# Patient Record
Sex: Male | Born: 1935 | Race: White | Hispanic: No | Smoking: Never smoker
Health system: Southern US, Community
[De-identification: ages and names within clinical notes are randomized; demographics above are authoritative.]

## PROBLEM LIST (undated history)

## (undated) DIAGNOSIS — I509 Heart failure, unspecified: Secondary | ICD-10-CM

## (undated) DIAGNOSIS — N429 Disorder of prostate, unspecified: Secondary | ICD-10-CM

## (undated) DIAGNOSIS — I4891 Unspecified atrial fibrillation: Secondary | ICD-10-CM

## (undated) DIAGNOSIS — R001 Bradycardia, unspecified: Secondary | ICD-10-CM

## (undated) HISTORY — PX: HIP SURGERY: SHX245

## (undated) HISTORY — PX: COLONOSCOPY: SHX174

## (undated) HISTORY — PX: APPENDECTOMY: SHX54

---

## 1997-10-21 ENCOUNTER — Ambulatory Visit (HOSPITAL_COMMUNITY): Admission: RE | Admit: 1997-10-21 | Discharge: 1997-10-21 | Payer: Self-pay | Admitting: Internal Medicine

## 1997-11-20 ENCOUNTER — Ambulatory Visit (HOSPITAL_COMMUNITY): Admission: RE | Admit: 1997-11-20 | Discharge: 1997-11-20 | Payer: Self-pay | Admitting: Internal Medicine

## 1997-12-17 ENCOUNTER — Ambulatory Visit (HOSPITAL_COMMUNITY): Admission: RE | Admit: 1997-12-17 | Discharge: 1997-12-17 | Payer: Self-pay | Admitting: Gastroenterology

## 1998-03-04 ENCOUNTER — Encounter: Payer: Self-pay | Admitting: Internal Medicine

## 1998-03-04 ENCOUNTER — Ambulatory Visit (HOSPITAL_COMMUNITY): Admission: RE | Admit: 1998-03-04 | Discharge: 1998-03-04 | Payer: Self-pay | Admitting: Internal Medicine

## 1998-10-19 ENCOUNTER — Ambulatory Visit (HOSPITAL_COMMUNITY): Admission: RE | Admit: 1998-10-19 | Discharge: 1998-10-19 | Payer: Self-pay | Admitting: Internal Medicine

## 2000-02-08 ENCOUNTER — Encounter: Payer: Self-pay | Admitting: Internal Medicine

## 2000-02-08 ENCOUNTER — Encounter: Admission: RE | Admit: 2000-02-08 | Discharge: 2000-02-08 | Payer: Self-pay | Admitting: Internal Medicine

## 2001-07-25 ENCOUNTER — Encounter: Payer: Self-pay | Admitting: Orthopedic Surgery

## 2001-07-25 ENCOUNTER — Encounter: Payer: Self-pay | Admitting: Emergency Medicine

## 2001-07-25 ENCOUNTER — Inpatient Hospital Stay (HOSPITAL_COMMUNITY): Admission: EM | Admit: 2001-07-25 | Discharge: 2001-07-26 | Payer: Self-pay | Admitting: Emergency Medicine

## 2002-12-30 ENCOUNTER — Ambulatory Visit (HOSPITAL_COMMUNITY): Admission: RE | Admit: 2002-12-30 | Discharge: 2002-12-30 | Payer: Self-pay | Admitting: Gastroenterology

## 2003-10-28 ENCOUNTER — Encounter: Admission: RE | Admit: 2003-10-28 | Discharge: 2003-10-28 | Payer: Self-pay | Admitting: Orthopedic Surgery

## 2004-06-24 ENCOUNTER — Encounter: Admission: RE | Admit: 2004-06-24 | Discharge: 2004-06-24 | Payer: Self-pay | Admitting: Internal Medicine

## 2004-12-14 ENCOUNTER — Encounter: Admission: RE | Admit: 2004-12-14 | Discharge: 2004-12-14 | Payer: Self-pay | Admitting: Internal Medicine

## 2005-08-11 ENCOUNTER — Ambulatory Visit: Payer: Self-pay

## 2005-08-12 ENCOUNTER — Ambulatory Visit: Payer: Self-pay | Admitting: Cardiovascular Disease

## 2005-08-15 ENCOUNTER — Inpatient Hospital Stay (HOSPITAL_COMMUNITY): Admission: RE | Admit: 2005-08-15 | Discharge: 2005-08-18 | Payer: Self-pay | Admitting: Orthopedic Surgery

## 2005-09-07 ENCOUNTER — Encounter: Admission: RE | Admit: 2005-09-07 | Discharge: 2005-10-03 | Payer: Self-pay | Admitting: Internal Medicine

## 2005-10-04 ENCOUNTER — Encounter: Admission: RE | Admit: 2005-10-04 | Discharge: 2005-11-07 | Payer: Self-pay | Admitting: Internal Medicine

## 2005-11-08 ENCOUNTER — Encounter: Admission: RE | Admit: 2005-11-08 | Discharge: 2005-11-17 | Payer: Self-pay | Admitting: Internal Medicine

## 2007-09-27 ENCOUNTER — Encounter (INDEPENDENT_AMBULATORY_CARE_PROVIDER_SITE_OTHER): Payer: Self-pay | Admitting: Internal Medicine

## 2007-09-27 ENCOUNTER — Ambulatory Visit (HOSPITAL_COMMUNITY): Admission: RE | Admit: 2007-09-27 | Discharge: 2007-09-27 | Payer: Self-pay | Admitting: Internal Medicine

## 2008-12-19 ENCOUNTER — Emergency Department (HOSPITAL_COMMUNITY): Admission: EM | Admit: 2008-12-19 | Discharge: 2008-12-19 | Payer: Self-pay | Admitting: Family Medicine

## 2009-07-20 ENCOUNTER — Emergency Department (HOSPITAL_COMMUNITY): Admission: EM | Admit: 2009-07-20 | Discharge: 2009-07-20 | Payer: Self-pay | Admitting: Family Medicine

## 2010-07-30 NOTE — Op Note (Signed)
Duane Spears, Duane Spears                ACCOUNT NO.:  [id number]   MEDICAL RECORD NO.:  [id number]          PATIENT TYPE:  INP   LOCATION:  2550                         FACILITY:  [facility]   PHYSICIAN:  Robert A. Noemi Chapel, M.D. DATE OF BIRTH:  October 10, 1920   DATE OF PROCEDURE:  08/15/2005  DATE OF DISCHARGE:                                 OPERATIVE REPORT   PREOPERATIVE DIAGNOSIS:  Left hip degenerative joint disease.   POSTOPERATIVE DIAGNOSIS:  Left hip degenerative joint disease.   PROCEDURE:  Left total hip replacement using DePuy cementless Summit/ASR  total hip system with #60 press-fit ASR acetabular cup with #8 press-fit  Summit femoral stem with +2 x 55-mm femoral head.   SURGEON:  Audree Camel. Noemi Chapel, M.D.   ASSISTANT:  Matthew Saras, P.A.   ANESTHESIA:  General.   OPERATIVE TIME:  One hour and 30 minutes.   ESTIMATED BLOOD LOSS:  300 cc.   COMPLICATIONS:  None.   DESCRIPTION OF PROCEDURE:  Duane Spears was brought to the operating room, on  August 15, 2005, and placed on the operative table in supine position.  He  received 2 grams of Ancef IV preoperatively for prophylaxis.  After being  placed under general anesthesia, he had a Foley catheter placed under  sterile conditions.  His leg lengths were checked and was found to 1 inch of  shortening on the left compared to the right.  Hip range of motion was noted  at 90 degrees of flexion, 20 degrees of extension, internal and external  rotation of 20 degrees.  After being placed under general anesthesia, he was  then placed in the left lateral decubitus position, and secured on the bed  with the Mark frame.  His left hip and leg were prepped using sterile  DuraPrep and draped using sterile technique.  Originally, through a 15 cm  posterolateral greater trochanteric incision, initial exposure was made.  The underlying subcutaneous tissues were incised along with the skin  incision.  The iliotibial band and gluteus maximus fascia  were incised  longitudinally, revealing the underlying short external rotators of the hip.  Sciatic nerve carefully protected while the short external rotators of the  hip and hip capsule were released off their femoral neck insertion and  tagged.  The hip was then posteriorly dislocated.  He was found to have  grade 3 and 4 DJD of the femoral head.  Femoral neck cut was made 1.5-2 cm  above the lesser trochanter, in the appropriate amount of anteversion,  abduction and inclination.  Carefully placed retractors were placed around  the acetabulum.  Degenerative labrum was then removed from around the  acetabulum.  The acetabulum showed grade 3 and 4 DJD chondromalacia as well.  Sequential acetabular reamers were then used to ream up to a 59 mm size and  the appropriate amount of anteversion, abduction and inclination, and then a  60-mm trial cup was placed.  This gave an excellent press-fit.  It was then  removed and the actual 60 ASR cup was hammered into position in the  appropriate amount of anteversion,  abduction and inclination with an  excellent press-fit.  After this was done, the proximal femur was then  exposed.  Sequential axial reamers were used to ream up to a #8 size,  followed by broaches to a #8 size.  With the #8 broach trial in place and a  +2, 55-mm ASR head, the hip was reduced and taken through a full range of  motion, found to be stable up to 70 degrees of internal rotation, in both  neutral and 90 degrees of flexion, and neutral and 80 degrees of adduction,  and also stable in abduction and external rotation, and leg lengths were  found to be equalized.  At this point then the femoral trial was removed.  The femoral canal was irrigated, and then the actual #8 Summit stem was  hammered in position with an excellent press-fit.  The +2 x 55-mm femoral  head was placed on the femoral neck, and hammered into position with an  excellent Morse taper hip.  The hip then reduced,  again taken through a full  range of motion, found to be stable in neutral and 30 degrees of adduction,  up to 70 degrees of internal rotation.  Leg lengths were found to be equal,  also stable in abduction, external rotation.  At this point it was felt that  all the components were of excellent size, fit and stability.  The wound was  then further irrigated.  The short external rotators of the hip and hip  capsule were then reattached to their femoral neck insertion, through two  drill holes in the greater trochanter.  Iliotibial band and gluteus maximus  fascia were then closed with #1 Ethibond sutures.  Subcutaneous tissues were  closed with 0 and 2-0 Vicryl, and skin closed with skin staples.  Sterile  dressings were applied, knee immobilizer was placed.  Patient was turned  supine, checked for leg lengths that were equal, rotation was equal, pulses  2+ and symmetric.  He was then awakened, extubated and taken to recovery  room in stable condition.  Needle and sponge counts were correct times two  at the end of the case.      Robert A. Noemi Chapel, M.D.  Electronically Signed     RAW/MEDQ  D:  08/15/2005  T:  08/16/2005  Job:  [id number]

## 2010-07-30 NOTE — Op Note (Signed)
[place]  Patient:    Duane Spears, Duane Spears Visit Number: [id number] MRN: [id number]          Service Type: SUR Location: [place] 01 Attending Physician:  Lara Mulch Dictated by:   Lara Mulch, M.D. Proc. Date: 07/25/01 Admit Date:  07/25/2001 Discharge Date: 07/26/2001                             Operative Report  PREOPERATIVE DIAGNOSIS:  Right total hip dislocation.  POSTOPERATIVE DIAGNOSIS:  Right total hip dislocation.  OPERATION PERFORMED:  Closed reduction of right total hip dislocation under anesthesia.  SURGEON:  Lara Mulch, M.D.  ASSISTANT:  None.  ANESTHESIA:  Mask general.  INDICATIONS FOR PROCEDURE:  The patient is a 77 year old white male six to eight years status post primary total hip arthroplasty with a dislocation episode two years ago.  It closed.  He was tying his shoe, felt a pop and was brought to the emergency room by his wife and son.  Preoperative medical clearance was obtained and cleared by the anesthesia staff and informed consent was obtained.  DESCRIPTION OF PROCEDURE:  The patient was laid supine and administered general anesthesia.  A flexion adduction of the hip was performed and the patient was very easily relocated.  This was verified with AP and lateral imaging of the C-arm.  I then tested it and it did impinge at approximately 80 to 85 degrees of hip flexion and approximately 10 degrees of internal rotation, adduction.  I then placed the patient in an abduction pillow and he was awakened and taken to the recovery room in stable condition.  COMPLICATIONS:  None.  DRAINS:  None.  ESTIMATED BLOOD LOSS:  None. Dictated by:   Lara Mulch, M.D. Attending Physician:  Lara Mulch DD:  07/25/01 TD:  07/27/01 Job: [id number]

## 2010-07-30 NOTE — Discharge Summary (Signed)
[name], [name]                ACCOUNT NO.:  [id number]   MEDICAL RECORD NO.:  [id number]          PATIENT TYPE:  INP   LOCATION:  5006                         FACILITY:  [facility]   PHYSICIAN:  Duane Spears, M.D. DATE OF BIRTH:  06/01/1920   DATE OF ADMISSION:  08/15/2005  DATE OF DISCHARGE:  08/18/2005                                 DISCHARGE SUMMARY   ADMISSION DIAGNOSES:  1.  End-stage degenerative joint disease, left hip.  2.  Hypertension.  3.  History of a myocardial infarction.  4.  First degree atrioventricular block.  5.  History of kidney stones.   DISCHARGE DIAGNOSES:  1.  End-stage degenerative joint disease, left hip, status post total hip      replacement.  2.  Urinary retention.  3.  Hyperglycemia.  4.  Hypertension.  5.  History of a myocardial infarction.  6.  First degree atrioventricular block.   HISTORY OF PRESENT ILLNESS:  The patient is an 77 year old white male with  history of end-stage DJD of his left hip.  He is status post a right total  hip in 1995 and has done very well with his right total hip.  Now he has  pain at night, pain with rest, pain unrelieved by conservative care.  He  understands the risks, benefits and possible complications of a left total  hip replacement and is without question.   PROCEDURES IN-HOUSE:  On August 15, 2005, the patient underwent a left total  hip replacement by Dr. Noemi Spears.  He tolerated the procedure well.  Other  procedures in-house:  He had a Foley that was removed postop day 1, had  difficulty with urinary retention, subsequently underwent 3 in-and-out  catheterizations with the third catheter being left in.  On August 18, 2005,  his postvoid residual was 1100 mL at the time of the third catheterization;  therefore, the catheter was left in.   HOSPITAL COURSE:  The patient was admitted postoperatively from his left  total hip for pain control, DVT prophylaxis and physical therapy.  Postop  day 1 the catheter  was removed, 2-1/2 hours later the patient was in-and-out  catheterized with a postvoid residual of 600 mL.  Social work was consulted  for skilled nursing placement.  He had a T-max of 101.3.  His hemoglobin was  13.1.  He was metabolically stable.  Urecholine was started at 25 mg q.6h.  Postop day #2 the patient once again spiked a temperature of 102.  At that  time a urine was sent, in which the culture grew nothing.  Chest x-ray was  done, which came back negative.  Blood cultures so far are negative.  Postop  day #2, T-max of 102.  Hemoglobin was 12.4.  He was metabolically stable  with a BUN of 16 and a creatinine of 1.0.  He had a small amount of drainage  from his wound and was neurovascularly intact.  Postop day #3, the patient  was afebrile x24 hours.  His hemoglobin was 11.2.  His renal function was  decreased with a BUN of 24 and  a creatinine of 1.4.  At that time his  Urecholine was stopped.  He was continued on Flomax 0.4 mg.  He was having  pressure from urinary retention.  The catheter was placed with 1100 mL of  postvoid residual.  The patient was much more comfortable with the catheter  in place.  Spoke with Dr. Serita Spears from urology.  He suggested leaving the  catheter with a leg bag for 7 days.  On August 25, 2005, the patient needs to  have the catheter removed at 6 o'clock in the morning.  He has an  appointment with Dr. Serita Spears at 1:45 at the urology center.  The telephone  number there is [phone].   With regard to his left hip wound, it is clean and dry with no redness, no  excessive ecchymosis, minimal swelling and minimal drainage.  He is to have  a clean dry dressing placed on his hip wound daily, and please call Dr.  Archie Spears office at [phone] with increased swelling, increased pain,  increased redness, increased drainage or a temperature greater than 101.   While in the hospital his morning CBGs have been in the 160s-170s, so he  will probably need CBGs  a.c. and h.s. to monitor those and a sliding scale  if necessary.   He will need physical therapy and occupational therapy daily for his left  total hip replacement.  He is 50% weightbearing on his left leg.  He needs  to use posterior total hip precautions with his left leg and needs to sleep  in a knee immobilizer.  He does not need to wear a knee immobilizer except  when he is in bed.  His follow-up appointment with Dr. Noemi Spears is on August 29, 2005, at 3:45 p.m.  At that time his staples will be removed.  He will have  x-rays and most likely he will be progressed.   While in the hospital he has been on Tylenol 325 mg two tablets every 4  hours for pain.  He has not required any narcotics for pain control.   DISCHARGE MEDICATIONS:  1.  Tylenol 2 tablets q.4h. for pain.  2.  Lovenox 30 mg one injection subcu q.12h. for 10 days for DVT      prophylaxis.  3.  Flomax 0.4 mg one tablet a day.  4.  Atenolol 50 mg one tablet a day.  5.  Aspirin 81 mg one tablet a day.  6.  Prevacid 30 mg one tablet a day.   Due to his decreased renal function, we have recommended holding his anti-  inflammatory at that time.  He was on diclofenac at home.  He was also on  Darvocet and Tylenol ES for pain.  Due to the amount of Tylenol in both of  these, I would recommend they be held at this time and no glucosamine.   My suggestion is a carbohydrate-modified 50 g medium diet due to his  increased sugars, increased CBGs in the hospital.  He is 50% weightbearing,  up ad lib. with supervision or assistance.      Duane Spears, P.A.      Duane Spears, M.D.  Electronically Signed    KS/MEDQ  D:  08/18/2005  T:  08/18/2005  Job:  [id number]

## 2010-07-30 NOTE — Op Note (Signed)
   NAME:  Duane Spears, Duane Spears                          ACCOUNT NO.:  [id number]   MEDICAL RECORD NO.:  [id number]                   PATIENT TYPE:  AMB   LOCATION:  ENDO                                 [facility]ACILITY[facility]   PHYSICIAN:  Earle Gell, M.D.                DATE OF BIRTH:  Oct 26, 1920   DATE OF PROCEDURE:  12/30/2002  DATE OF DISCHARGE:                                 OPERATIVE REPORT   PROCEDURE:  Surveillance colonoscopy.   PROCEDURE INDICATION:  Mr. [name] is a 77 year old male born Aug 20, 1920.  In 1999 Mr. [name] underwent his first colonoscopy to remove colon  polyps.  He is scheduled to undergo a surveillance colonoscopy with  polypectomy to prevent colon cancer.   ENDOSCOPIST:  Earle Gell, M.D.   PREMEDICATION:  Versed 5 mg, Demerol 50 mg.   DESCRIPTION OF PROCEDURE:  After obtaining informed consent, Mr. [name] was  placed in the left lateral decubitus position.  I administered intravenous  Demerol and intravenous Versed to achieve conscious sedation for the  procedure.  The patient's blood pressure, oxygen saturation, and cardiac  rhythm were monitored throughout the procedure and documented in the medical  record.   Anal inspection was normal.  Digital rectal exam revealed a non-nodular  prostate.  The Olympus pediatric colonoscope was introduced into the rectum  and advanced to the cecum.  Colonic preparation for the exam today was  excellent.   Rectum normal.   Sigmoid colon and descending colon:  Left colonic diverticulosis.   Splenic flexure normal.   Transverse colon normal.   Hepatic flexure normal.   Ascending colon normal.   Cecum and ileocecal valve normal.    ASSESSMENT:  1. Left colonic diverticulosis.  2. No endoscopic evidence for the presence of recurrent colorectal     neoplasia.   RECOMMENDATIONS:  Consider virtual colonoscopy or optical colonoscopy in  five years.                                               Earle Gell, M.D.    MJ/MEDQ  D:  12/30/2002  T:  12/30/2002  Job:  [id number]   cc:   Lance Muss, M.D.  [address] [place]  [place] [phone]  Fax: [phone]

## 2010-10-03 ENCOUNTER — Emergency Department (HOSPITAL_COMMUNITY)
Admission: EM | Admit: 2010-10-03 | Discharge: 2010-10-04 | Disposition: A | Discharge status: Home / Self Care | Payer: Medicare Other | Attending: Emergency Medicine | Admitting: Emergency Medicine

## 2010-10-03 DIAGNOSIS — R300 Dysuria: Secondary | ICD-10-CM | POA: Insufficient documentation

## 2010-10-03 DIAGNOSIS — N390 Urinary tract infection, site not specified: Secondary | ICD-10-CM | POA: Insufficient documentation

## 2010-10-03 DIAGNOSIS — R509 Fever, unspecified: Secondary | ICD-10-CM | POA: Insufficient documentation

## 2010-10-03 DIAGNOSIS — Z79899 Other long term (current) drug therapy: Secondary | ICD-10-CM | POA: Insufficient documentation

## 2010-10-03 DIAGNOSIS — I4891 Unspecified atrial fibrillation: Secondary | ICD-10-CM | POA: Insufficient documentation

## 2010-10-04 ENCOUNTER — Emergency Department (HOSPITAL_COMMUNITY): Payer: Medicare Other

## 2010-10-04 LAB — URINE MICROSCOPIC-ADD ON

## 2010-10-04 LAB — URINALYSIS, ROUTINE W REFLEX MICROSCOPIC
Bilirubin Urine: NEGATIVE
Glucose, UA: NEGATIVE mg/dL
Ketones, ur: NEGATIVE mg/dL
Nitrite: NEGATIVE
Protein, ur: 30 mg/dL — AB
Specific Gravity, Urine: 1.026 (ref 1.005–1.030)
Urobilinogen, UA: 0.2 mg/dL (ref 0.0–1.0)
pH: 6 (ref 5.0–8.0)

## 2010-10-04 LAB — DIFFERENTIAL
Basophils Absolute: 0 10*3/uL (ref 0.0–0.1)
Basophils Relative: 0 % (ref 0–1)
Eosinophils Absolute: 0 10*3/uL (ref 0.0–0.7)
Eosinophils Relative: 0 % (ref 0–5)
Lymphocytes Relative: 6 % — ABNORMAL LOW (ref 12–46)
Lymphs Abs: 0.7 10*3/uL (ref 0.7–4.0)
Monocytes Absolute: 1 10*3/uL (ref 0.1–1.0)
Monocytes Relative: 9 % (ref 3–12)
Neutro Abs: 9.6 10*3/uL — ABNORMAL HIGH (ref 1.7–7.7)
Neutrophils Relative %: 85 % — ABNORMAL HIGH (ref 43–77)

## 2010-10-04 LAB — BASIC METABOLIC PANEL
BUN: 19 mg/dL (ref 6–23)
CO2: 22 mEq/L (ref 19–32)
Calcium: 8.8 mg/dL (ref 8.4–10.5)
Chloride: 100 mEq/L (ref 96–112)
Creatinine, Ser: 0.68 mg/dL (ref 0.50–1.35)
GFR calc Af Amer: >60 mL/min (ref >60)
GFR calc non Af Amer: >60 mL/min (ref >60)
Glucose, Bld: 177 mg/dL — ABNORMAL HIGH (ref 70–99)
Potassium: 4 mEq/L (ref 3.5–5.1)
Sodium: 133 mEq/L — ABNORMAL LOW (ref 135–145)

## 2010-10-04 LAB — CBC
HCT: 44.4 % (ref 39.0–52.0)
Hemoglobin: 15 g/dL (ref 13.0–17.0)
MCH: 32.1 pg (ref 26.0–34.0)
MCHC: 33.8 g/dL (ref 30.0–36.0)
MCV: 95.1 fL (ref 78.0–100.0)
Platelets: 105 10*3/uL — ABNORMAL LOW (ref 150–400)
RBC: 4.67 MIL/uL (ref 4.22–5.81)
RDW: 14 % (ref 11.5–15.5)
WBC: 11.3 10*3/uL — ABNORMAL HIGH (ref 4.0–10.5)

## 2010-10-06 LAB — URINE CULTURE
Colony Count: >=100000
Culture  Setup Time: 201207230938

## 2010-10-10 LAB — CULTURE, BLOOD (ROUTINE X 2)
Culture  Setup Time: 201207230920
Culture  Setup Time: 201207230921
Culture: NO GROWTH
Culture: NO GROWTH

## 2011-12-22 ENCOUNTER — Other Ambulatory Visit (HOSPITAL_COMMUNITY): Payer: Self-pay | Admitting: Orthopedic Surgery

## 2011-12-22 DIAGNOSIS — M25559 Pain in unspecified hip: Secondary | ICD-10-CM

## 2011-12-27 ENCOUNTER — Ambulatory Visit (HOSPITAL_COMMUNITY)
Admission: RE | Admit: 2011-12-27 | Discharge: 2011-12-27 | Disposition: A | Discharge status: Home / Self Care | Payer: Medicare Other | Source: Ambulatory Visit | Attending: Orthopedic Surgery | Admitting: Orthopedic Surgery

## 2011-12-27 ENCOUNTER — Ambulatory Visit (HOSPITAL_COMMUNITY)
Admission: RE | Admit: 2011-12-27 | Discharge: 2011-12-27 | Disposition: A | Discharge status: Home / Self Care | Payer: Medicare Other | Source: Ambulatory Visit | Attending: Diagnostic Radiology | Admitting: Diagnostic Radiology

## 2011-12-27 ENCOUNTER — Other Ambulatory Visit (HOSPITAL_COMMUNITY): Payer: Self-pay | Admitting: Diagnostic Radiology

## 2011-12-27 DIAGNOSIS — M25559 Pain in unspecified hip: Secondary | ICD-10-CM | POA: Insufficient documentation

## 2011-12-27 DIAGNOSIS — IMO0002 Reserved for concepts with insufficient information to code with codable children: Secondary | ICD-10-CM

## 2011-12-27 DIAGNOSIS — K5730 Diverticulosis of large intestine without perforation or abscess without bleeding: Secondary | ICD-10-CM | POA: Insufficient documentation

## 2011-12-27 DIAGNOSIS — Z96649 Presence of unspecified artificial hip joint: Secondary | ICD-10-CM | POA: Insufficient documentation

## 2011-12-27 DIAGNOSIS — N400 Enlarged prostate without lower urinary tract symptoms: Secondary | ICD-10-CM | POA: Insufficient documentation

## 2011-12-27 DIAGNOSIS — R262 Difficulty in walking, not elsewhere classified: Secondary | ICD-10-CM | POA: Insufficient documentation

## 2013-10-11 ENCOUNTER — Emergency Department (INDEPENDENT_AMBULATORY_CARE_PROVIDER_SITE_OTHER)
Admission: EM | Admit: 2013-10-11 | Discharge: 2013-10-11 | Disposition: A | Discharge status: Nursing Home (Includes ICF) | Payer: Medicare Other | Source: Home / Self Care | Attending: Family Medicine | Admitting: Family Medicine

## 2013-10-11 ENCOUNTER — Encounter (HOSPITAL_COMMUNITY): Payer: Self-pay | Admitting: Emergency Medicine

## 2013-10-11 ENCOUNTER — Inpatient Hospital Stay (HOSPITAL_COMMUNITY)
Admission: EM | Admit: 2013-10-11 | Discharge: 2013-10-14 | DRG: 690 | Disposition: A | Discharge status: Home / Self Care | Payer: Medicare Other | Attending: Internal Medicine | Admitting: Internal Medicine

## 2013-10-11 ENCOUNTER — Emergency Department (INDEPENDENT_AMBULATORY_CARE_PROVIDER_SITE_OTHER): Payer: Medicare Other

## 2013-10-11 DIAGNOSIS — Z66 Do not resuscitate: Secondary | ICD-10-CM | POA: Diagnosis present

## 2013-10-11 DIAGNOSIS — I4891 Unspecified atrial fibrillation: Secondary | ICD-10-CM | POA: Diagnosis present

## 2013-10-11 DIAGNOSIS — B965 Pseudomonas (aeruginosa) (mallei) (pseudomallei) as the cause of diseases classified elsewhere: Secondary | ICD-10-CM | POA: Diagnosis present

## 2013-10-11 DIAGNOSIS — A499 Bacterial infection, unspecified: Secondary | ICD-10-CM

## 2013-10-11 DIAGNOSIS — N390 Urinary tract infection, site not specified: Secondary | ICD-10-CM

## 2013-10-11 DIAGNOSIS — R6510 Systemic inflammatory response syndrome (SIRS) of non-infectious origin without acute organ dysfunction: Secondary | ICD-10-CM

## 2013-10-11 DIAGNOSIS — Z7982 Long term (current) use of aspirin: Secondary | ICD-10-CM

## 2013-10-11 DIAGNOSIS — B9689 Other specified bacterial agents as the cause of diseases classified elsewhere: Secondary | ICD-10-CM

## 2013-10-11 DIAGNOSIS — D72829 Elevated white blood cell count, unspecified: Secondary | ICD-10-CM | POA: Diagnosis present

## 2013-10-11 DIAGNOSIS — I509 Heart failure, unspecified: Secondary | ICD-10-CM | POA: Diagnosis present

## 2013-10-11 DIAGNOSIS — N3000 Acute cystitis without hematuria: Secondary | ICD-10-CM

## 2013-10-11 DIAGNOSIS — A498 Other bacterial infections of unspecified site: Secondary | ICD-10-CM | POA: Diagnosis present

## 2013-10-11 DIAGNOSIS — N400 Enlarged prostate without lower urinary tract symptoms: Secondary | ICD-10-CM | POA: Diagnosis present

## 2013-10-11 DIAGNOSIS — A4150 Gram-negative sepsis, unspecified: Secondary | ICD-10-CM

## 2013-10-11 DIAGNOSIS — R509 Fever, unspecified: Secondary | ICD-10-CM

## 2013-10-11 HISTORY — DX: Disorder of prostate, unspecified: N42.9

## 2013-10-11 HISTORY — DX: Heart failure, unspecified: I50.9

## 2013-10-11 LAB — POCT URINALYSIS DIP (DEVICE)
Bilirubin Urine: NEGATIVE
Glucose, UA: NEGATIVE mg/dL
Ketones, ur: NEGATIVE mg/dL
Nitrite: POSITIVE — AB
Protein, ur: 30 mg/dL — AB
Specific Gravity, Urine: 1.015 (ref 1.005–1.030)
Urobilinogen, UA: 0.2 mg/dL (ref 0.0–1.0)
pH: 5.5 (ref 5.0–8.0)

## 2013-10-11 LAB — BASIC METABOLIC PANEL
Anion gap: 15 (ref 5–15)
BUN: 29 mg/dL — ABNORMAL HIGH (ref 6–23)
CO2: 25 mEq/L (ref 19–32)
Calcium: 9.1 mg/dL (ref 8.4–10.5)
Chloride: 99 mEq/L (ref 96–112)
Creatinine, Ser: 0.92 mg/dL (ref 0.50–1.35)
GFR, EST AFRICAN AMERICAN: 84 mL/min — AB (ref >90)
GFR, EST NON AFRICAN AMERICAN: 72 mL/min — AB (ref >90)
Glucose, Bld: 153 mg/dL — ABNORMAL HIGH (ref 70–99)
Potassium: 3.8 mEq/L (ref 3.7–5.3)
SODIUM: 139 meq/L (ref 137–147)

## 2013-10-11 LAB — CBC WITH DIFFERENTIAL/PLATELET
BASOS PCT: 0 % (ref 0–1)
Basophils Absolute: 0 10*3/uL (ref 0.0–0.1)
EOS ABS: 0 10*3/uL (ref 0.0–0.7)
Eosinophils Relative: 0 % (ref 0–5)
HCT: 39.7 % (ref 39.0–52.0)
Hemoglobin: 13.4 g/dL (ref 13.0–17.0)
Lymphocytes Relative: 8 % — ABNORMAL LOW (ref 12–46)
Lymphs Abs: 1.3 10*3/uL (ref 0.7–4.0)
MCH: 32.1 pg (ref 26.0–34.0)
MCHC: 33.8 g/dL (ref 30.0–36.0)
MCV: 95 fL (ref 78.0–100.0)
Monocytes Absolute: 1.5 10*3/uL — ABNORMAL HIGH (ref 0.1–1.0)
Monocytes Relative: 10 % (ref 3–12)
NEUTROS PCT: 82 % — AB (ref 43–77)
Neutro Abs: 12.8 10*3/uL — ABNORMAL HIGH (ref 1.7–7.7)
PLATELETS: 142 10*3/uL — AB (ref 150–400)
RBC: 4.18 MIL/uL — AB (ref 4.22–5.81)
RDW: 14.1 % (ref 11.5–15.5)
WBC: 15.6 10*3/uL — ABNORMAL HIGH (ref 4.0–10.5)

## 2013-10-11 LAB — I-STAT CG4 LACTIC ACID, ED: LACTIC ACID, VENOUS: 1.69 mmol/L (ref 0.5–2.2)

## 2013-10-11 MED ORDER — CEFTRIAXONE SODIUM 1 G IJ SOLR
1.0000 g | Freq: Once | INTRAMUSCULAR | Status: AC
  Administered 2013-10-11: 1 g via INTRAVENOUS
  Filled 2013-10-11: qty 10

## 2013-10-11 MED ORDER — FINASTERIDE 5 MG PO TABS
5.0000 mg | ORAL_TABLET | Freq: Every day | ORAL | Status: DC
  Administered 2013-10-12 – 2013-10-14 (×3): 5 mg via ORAL
  Filled 2013-10-11 (×3): qty 1

## 2013-10-11 MED ORDER — CRANBERRY 400 MG PO CAPS: 400.0000 mg | ORAL_CAPSULE | Freq: Two times a day (BID) | ORAL | Status: DC

## 2013-10-11 MED ORDER — ASPIRIN 81 MG PO TABS: 81.0000 mg | ORAL_TABLET | Freq: Every day | ORAL | Status: DC

## 2013-10-11 MED ORDER — HYDROCODONE-ACETAMINOPHEN 5-325 MG PO TABS: 1.0000 | ORAL_TABLET | Freq: Four times a day (QID) | ORAL | Status: DC | PRN

## 2013-10-11 MED ORDER — ONDANSETRON HCL 4 MG/2ML IJ SOLN: 4.0000 mg | Freq: Four times a day (QID) | INTRAMUSCULAR | Status: DC | PRN

## 2013-10-11 MED ORDER — ADULT MULTIVITAMIN LIQUID CH
5.0000 mL | Freq: Every day | ORAL | Status: DC
  Administered 2013-10-12 – 2013-10-14 (×3): 5 mL via ORAL
  Filled 2013-10-11 (×3): qty 5

## 2013-10-11 MED ORDER — ASPIRIN EC 81 MG PO TBEC
81.0000 mg | DELAYED_RELEASE_TABLET | Freq: Every day | ORAL | Status: DC
  Administered 2013-10-12 – 2013-10-14 (×3): 81 mg via ORAL
  Filled 2013-10-11 (×3): qty 1

## 2013-10-11 MED ORDER — CENTRUM PO LIQD: 5.0000 mL | Freq: Every day | ORAL | Status: DC

## 2013-10-11 MED ORDER — HYDROMORPHONE HCL PF 1 MG/ML IJ SOLN: 0.5000 mg | INTRAMUSCULAR | Status: DC | PRN

## 2013-10-11 MED ORDER — SODIUM CHLORIDE 0.9 % IV BOLUS (SEPSIS)
500.0000 mL | Freq: Once | INTRAVENOUS | Status: AC
  Administered 2013-10-11: 500 mL via INTRAVENOUS

## 2013-10-11 MED ORDER — GLUCOSAMINE-CHONDROITIN 500-400 MG PO TABS: 1.0000 | ORAL_TABLET | Freq: Every day | ORAL | Status: DC

## 2013-10-11 MED ORDER — ACETAMINOPHEN 650 MG RE SUPP: 650.0000 mg | Freq: Four times a day (QID) | RECTAL | Status: DC | PRN

## 2013-10-11 MED ORDER — ENOXAPARIN SODIUM 40 MG/0.4ML ~~LOC~~ SOLN
40.0000 mg | SUBCUTANEOUS | Status: DC
Start: 2013-10-11 — End: 2013-10-14
  Administered 2013-10-12 – 2013-10-13 (×2): 40 mg via SUBCUTANEOUS
  Filled 2013-10-11 (×4): qty 0.4

## 2013-10-11 MED ORDER — ALUM & MAG HYDROXIDE-SIMETH 200-200-20 MG/5ML PO SUSP
30.0000 mL | Freq: Four times a day (QID) | ORAL | Status: DC | PRN
Start: 2013-10-11 — End: 2013-10-14

## 2013-10-11 MED ORDER — ONDANSETRON HCL 4 MG/2ML IJ SOLN: 4.0000 mg | Freq: Three times a day (TID) | INTRAMUSCULAR | Status: DC | PRN

## 2013-10-11 MED ORDER — ONDANSETRON HCL 4 MG PO TABS: 4.0000 mg | ORAL_TABLET | Freq: Four times a day (QID) | ORAL | Status: DC | PRN

## 2013-10-11 MED ORDER — DEXTROSE 5 % IV SOLN
1.0000 g | INTRAVENOUS | Status: DC
  Administered 2013-10-12: 1 g via INTRAVENOUS
  Filled 2013-10-11 (×2): qty 10

## 2013-10-11 MED ORDER — ACETAMINOPHEN 325 MG PO TABS: 650.0000 mg | ORAL_TABLET | Freq: Four times a day (QID) | ORAL | Status: DC | PRN

## 2013-10-11 NOTE — ED Notes (Signed)
Josh, PA at bedside. °

## 2013-10-11 NOTE — ED Provider Notes (Signed)
Medical screening examination/treatment/procedure(s) were conducted as a shared visit with non-physician practitioner(s) and myself.  I personally evaluated the patient during the encounter.   EKG Interpretation None      Pt presents with myalgias, chills, fever, malaise since yesterday as well as UA appearing infected.  Pt is somewhat ill, but nontoxic appearing. Abdominal exam benign. Given age & PMHx, will admit for IV abx and continued observation.   Neta Ehlers, MD 10/11/13 2022

## 2013-10-11 NOTE — ED Provider Notes (Signed)
CSN: [id number]     Arrival date & time 10/11/13  1224 History   First MD Initiated Contact with Patient 10/11/13 1402     Chief Complaint  Patient presents with  . Fever  . Urinary Tract Infection     (Consider location/radiation/quality/duration/timing/severity/associated sxs/prior Treatment) HPI Comments: Patient with history of BPH, previous UTI -- presents with complaint of malaise and fever that began yesterday. Patient has had associated muscle aches and chills. He denies nausea, vomiting, or diarrhea. No chest pain or cough. No dysuria, hematuria. No skin rash or extremity swelling. Patient was seen at urgent care prior to arrival. Patient had elevated white blood cell count, UA positive for urinary tract infection, negative chest x-ray. Patient was sent to the emergency department with concern for sepsis and need for IV antibiotics. The onset of this condition was acute. The course is constant. Aggravating factors: none. Alleviating factors: none.    Patient is a 80 y.o. male presenting with fever and urinary tract infection. The history is provided by the patient and medical records.  Fever Associated symptoms: myalgias   Associated symptoms: no chest pain, no cough, no diarrhea, no dysuria, no headaches, no nausea, no rash, no rhinorrhea, no sore throat and no vomiting   Urinary Tract Infection Associated symptoms include fatigue, a fever and myalgias. Pertinent negatives include no abdominal pain, chest pain, coughing, headaches, nausea, rash, sore throat or vomiting.    Past Medical History  Diagnosis Date  . CHF (congestive heart failure)   . Prostate disorder    Past Surgical History  Procedure Laterality Date  . Appendectomy    . Hip surgery    . Colonoscopy     History reviewed. No pertinent family history. History  Substance Use Topics  . Smoking status: Never Smoker   . Smokeless tobacco: Not on file  . Alcohol Use: No    Review of Systems  Constitutional:  Positive for fever and fatigue.  HENT: Negative for rhinorrhea and sore throat.   Eyes: Negative for redness.  Respiratory: Negative for cough.   Cardiovascular: Negative for chest pain.  Gastrointestinal: Negative for nausea, vomiting, abdominal pain and diarrhea.  Genitourinary: Negative for dysuria.  Musculoskeletal: Positive for myalgias.  Skin: Negative for rash.  Neurological: Negative for headaches.   Allergies  Review of patient's allergies indicates no known allergies.  Home Medications   Prior to Admission medications   Medication Sig Start Date End Date Taking? Authorizing Provider  aspirin 81 MG tablet Take 81 mg by mouth daily.   Yes Historical Provider, MD  Cranberry 400 MG CAPS Take 400 mg by mouth 2 (two) times daily.   Yes Historical Provider, MD  finasteride (PROSCAR) 5 MG tablet Take 5 mg by mouth daily.   Yes Historical Provider, MD  furosemide (LASIX) 20 MG tablet Take 20-60 mg by mouth 2 (two) times daily. 60mg  in AM and 20mg  in PM   Yes Historical Provider, MD  glucosamine-chondroitin 500-400 MG tablet Take 1 tablet by mouth daily.   Yes Historical Provider, MD  Multiple Vitamins-Minerals (MULTIVITAMIN WITH IRON-MINERALS) liquid Take by mouth daily.   Yes Historical Provider, MD   BP 134/52  Pulse 63  Temp(Src) 98.5 F (36.9 C) (Oral)  Resp 20  SpO2 100%  Physical Exam  Nursing note and vitals reviewed. Constitutional: He appears well-developed and well-nourished.  HENT:  Head: Normocephalic and atraumatic.  Eyes: Conjunctivae are normal. Right eye exhibits no discharge. Left eye exhibits no discharge.  Neck: Normal  range of motion. Neck supple.  Cardiovascular: Normal rate and normal heart sounds.  An irregular rhythm present.  Pulmonary/Chest: Effort normal and breath sounds normal.  Abdominal: Soft. There is no tenderness.  Neurological: He is alert.  Skin: Skin is warm and dry.  Psychiatric: He has a normal mood and affect.    ED Course   Procedures (including critical care time) Labs Review Labs Reviewed  BASIC METABOLIC PANEL - Abnormal; Notable for the following:    Glucose, Bld 153 (*)    BUN 29 (*)    GFR calc non Af Amer 72 (*)    GFR calc Af Amer 84 (*)    All other components within normal limits  CULTURE, BLOOD (ROUTINE X 2)  CULTURE, BLOOD (ROUTINE X 2)  I-STAT CG4 LACTIC ACID, ED    Imaging Review Dg Chest 2 View  10/11/2013   CLINICAL DATA:  Fever.  EXAM: CHEST  2 VIEW  COMPARISON:  PA and lateral chest 10/04/2010.  FINDINGS: The lungs are clear. Heart size is mildly enlarged. No pneumothorax or pleural effusion. No focal bony abnormality.  IMPRESSION: No acute disease.   Electronically Signed   By: Inge Rise M.D.   On: 10/11/2013 10:33     EKG Interpretation None      2:40 PM Patient seen and examined. Work-up initiated. Medications ordered. D/w Dr. Tawnya Crook.   Vital signs reviewed and are as follows: Filed Vitals:   10/11/13 1234  BP: 134/52  Pulse: 63  Temp: 98.5 F (36.9 C)  Resp: 20   3:51 PM Patient seen by Dr. Tawnya Crook. Will admit for IV abx. Lactate is normal. Rocephin ordered.    MDM   Final diagnoses:  Acute cystitis without hematuria  Fever, unspecified fever cause   Admit for UTI, fever in 84 yoM.     Carlisle Cater, PA-C 10/11/13 [phone]

## 2013-10-11 NOTE — ED Provider Notes (Signed)
CSN: [id number]     Arrival date & time 10/11/13  0957 History   First MD Initiated Contact with Patient 10/11/13 1000     Chief Complaint  Patient presents with  . Fever   (Consider location/radiation/quality/duration/timing/severity/associated sxs/prior Treatment) Patient is a 80 y.o. male presenting with fever. The history is provided by the patient and a relative.  Fever Max temp prior to arrival:  102.4 Temp source:  Oral Severity:  Moderate Onset quality:  Sudden Duration:  1 day Progression:  Waxing and waning Chronicity:  New Associated symptoms: myalgias   Associated symptoms: no chest pain, no cough, no diarrhea, no dysuria, no nausea, no rash, no sore throat and no vomiting     Past Medical History  Diagnosis Date  . CHF (congestive heart failure)   . Prostate disorder    Past Surgical History  Procedure Laterality Date  . Appendectomy    . Hip surgery    . Colonoscopy     History reviewed. No pertinent family history. History  Substance Use Topics  . Smoking status: Never Smoker   . Smokeless tobacco: Not on file  . Alcohol Use: No    Review of Systems  Constitutional: Positive for fever. Negative for appetite change.  HENT: Negative for sore throat.   Respiratory: Negative for cough.   Cardiovascular: Negative for chest pain.  Gastrointestinal: Negative.  Negative for nausea, vomiting and diarrhea.  Genitourinary: Negative.  Negative for dysuria.  Musculoskeletal: Positive for myalgias.  Skin: Negative for rash.    Allergies  Review of patient's allergies indicates no known allergies.  Home Medications   Prior to Admission medications   Medication Sig Start Date End Date Taking? Authorizing Provider  aspirin 81 MG tablet Take 81 mg by mouth daily.   Yes Historical Provider, MD  finasteride (PROSCAR) 5 MG tablet Take 5 mg by mouth daily.   Yes Historical Provider, MD  FUROSEMIDE PO Take by mouth.   Yes Historical Provider, MD   BP 128/67   Pulse 63  Temp(Src) 100.7 F (38.2 C) (Oral)  Resp 21  SpO2 96% Physical Exam  Nursing note and vitals reviewed. Constitutional: He is oriented to person, place, and time. He appears well-developed and well-nourished.  Neck: Normal range of motion. Neck supple.  Cardiovascular: Normal rate, normal heart sounds and normal pulses.  An irregular rhythm present. PMI is not displaced.   Pulmonary/Chest: Effort normal and breath sounds normal.  Abdominal: Soft. Bowel sounds are normal. There is no tenderness.  Lymphadenopathy:    He has no cervical adenopathy.  Neurological: He is alert and oriented to person, place, and time.  Skin: Skin is warm and dry.    ED Course  Procedures (including critical care time) Labs Review Labs Reviewed  CBC WITH DIFFERENTIAL - Abnormal; Notable for the following:    WBC 15.6 (*)    RBC 4.18 (*)    Platelets 142 (*)    Neutrophils Relative % 82 (*)    Neutro Abs 12.8 (*)    Lymphocytes Relative 8 (*)    Monocytes Absolute 1.5 (*)    All other components within normal limits  POCT URINALYSIS DIP (DEVICE) - Abnormal; Notable for the following:    Hgb urine dipstick MODERATE (*)    Protein, ur 30 (*)    Nitrite POSITIVE (*)    Leukocytes, UA TRACE (*)    All other components within normal limits  URINE CULTURE    Imaging Review Dg Chest 2 View  10/11/2013   CLINICAL DATA:  Fever.  EXAM: CHEST  2 VIEW  COMPARISON:  PA and lateral chest 10/04/2010.  FINDINGS: The lungs are clear. Heart size is mildly enlarged. No pneumothorax or pleural effusion. No focal bony abnormality.  IMPRESSION: No acute disease.   Electronically Signed   By: Inge Rise M.D.   On: 10/11/2013 10:33     MDM   1. UTI (urinary tract infection), bacterial    Sent for iv abx for fever, r/o sepsis from uti.    Billy Fischer, MD 10/11/13 [phone]

## 2013-10-11 NOTE — ED Notes (Signed)
Pt denies pain. Reports feeling bad all over and fever x 1 night.  [facility] sent pt for possible IV antibiotics for WBC 15.6. Son reports pt has hx of UTIs requiring IV antibiotics.

## 2013-10-11 NOTE — H&P (Signed)
History and Physical       Hospital Admission Note Date: 10/11/2013  Patient name: Duane Spears Medical record number: [id number] Date of birth: Jul 17, 1920 Age: 80 y.o. Gender: male  PCP: GREEN, Keenan Bachelor, MD    Chief Complaint:  Fever, chills and malaise started yesterday  HPI: Patient is a 80 year old male with history of CHF, BPH, atrial fibrillation not on anticoagulation was sent from the urgent care for impending sepsis, UTI. History was obtained from the patient and his son in the room. Per patient, he started having fevers, chills and generalized weakness, malaise yesterday. At the time of arrival to the urgent care temp was 102.4. Patient had a prior history of UTI 2 years ago when he required IV antibiotics and hospitalization hence due to the same concern, he came to the urgent care today. ER workup shows white count of 15.6, UA positive for UTI, lactic acid 1.69  Review of Systems:  Constitutional: + fever, chills, diaphoresis, poor appetite and fatigue.  HEENT: Denies photophobia, eye pain, redness, hearing loss, ear pain, congestion, sore throat, rhinorrhea, sneezing, mouth sores, trouble swallowing, neck pain, neck stiffness and tinnitus.   Respiratory: Denies SOB, DOE, cough, chest tightness,  and wheezing.   Cardiovascular: Denies chest pain, palpitations and leg swelling.  Gastrointestinal: Denies nausea, vomiting, abdominal pain, diarrhea, constipation, blood in stool and abdominal distention.  Genitourinary: Patient has BPH and otherwise denies any dysuria or abdominal pain Musculoskeletal: + myalgias, denies back pain, joint swelling, arthralgias and gait problem.  Skin: Denies pallor, rash and wound.  Neurological: Denies dizziness, seizures, syncope, weakness, light-headedness, numbness and headaches.  Hematological: Denies adenopathy. Easy bruising, personal or family bleeding history  Psychiatric/Behavioral:  Denies suicidal ideation, mood changes, confusion, nervousness, sleep disturbance and agitation  Past Medical History: Past Medical History  Diagnosis Date  . CHF (congestive heart failure)   . Prostate disorder    Past Surgical History  Procedure Laterality Date  . Appendectomy    . Hip surgery    . Colonoscopy      Medications: Prior to Admission medications   Medication Sig Start Date End Date Taking? Authorizing Provider  aspirin 81 MG tablet Take 81 mg by mouth daily.   Yes Historical Provider, MD  Cranberry 400 MG CAPS Take 400 mg by mouth 2 (two) times daily.   Yes Historical Provider, MD  finasteride (PROSCAR) 5 MG tablet Take 5 mg by mouth daily.   Yes Historical Provider, MD  furosemide (LASIX) 20 MG tablet Take 20-60 mg by mouth 2 (two) times daily. 60mg  in AM and 20mg  in PM   Yes Historical Provider, MD  glucosamine-chondroitin 500-400 MG tablet Take 1 tablet by mouth daily.   Yes Historical Provider, MD  Multiple Vitamins-Minerals (MULTIVITAMIN WITH IRON-MINERALS) liquid Take by mouth daily.   Yes Historical Provider, MD    Allergies:  No Known Allergies  Social History:  reports that he has never smoked. He does not have any smokeless tobacco history on file. He reports that he does not drink alcohol. His drug history is not on file.  Family History: History reviewed. No pertinent family history.  Physical Exam: Blood pressure 145/65, pulse 73, temperature 98.5 F (36.9 C), temperature source Oral, resp. rate 20, SpO2 100.00%. General: Alert, awake, oriented x3, in no acute distress. HEENT: normocephalic, atraumatic, anicteric sclera, pink conjunctiva, pupils equal and reactive to light and accomodation, oropharynx clear Neck: supple, no masses or lymphadenopathy, no goiter, no bruits  Heart: Regular rate and  rhythm, without murmurs, rubs or gallops. Lungs: Clear to auscultation bilaterally, no wheezing, rales or rhonchi. Abdomen: Soft, nontender,  nondistended, positive bowel sounds, no masses. Extremities: No clubbing, cyanosis or edema with positive pedal pulses. Neuro: Grossly intact, no focal neurological deficits, strength 5/5 upper and lower extremities bilaterally Psych: alert and oriented x 3, normal mood and affect Skin: no rashes or lesions, warm and dry   LABS on Admission:  Basic Metabolic Panel:  Recent Labs Lab 10/11/13 1411  NA 139  K 3.8  CL 99  CO2 25  GLUCOSE 153*  BUN 29*  CREATININE 0.92  CALCIUM 9.1   Liver Function Tests: No results found for this basename: AST, ALT, ALKPHOS, BILITOT, PROT, ALBUMIN,  in the last 168 hours No results found for this basename: LIPASE, AMYLASE,  in the last 168 hours No results found for this basename: AMMONIA,  in the last 168 hours CBC:  Recent Labs Lab 10/11/13 1050  WBC 15.6*  NEUTROABS 12.8*  HGB 13.4  HCT 39.7  MCV 95.0  PLT 142*   Cardiac Enzymes: No results found for this basename: CKTOTAL, CKMB, CKMBINDEX, TROPONINI,  in the last 168 hours BNP: No components found with this basename: POCBNP,  CBG: No results found for this basename: GLUCAP,  in the last 168 hours   Radiological Exams on Admission: Dg Chest 2 View  10/11/2013   CLINICAL DATA:  Fever.  EXAM: CHEST  2 VIEW  COMPARISON:  PA and lateral chest 10/04/2010.  FINDINGS: The lungs are clear. Heart size is mildly enlarged. No pneumothorax or pleural effusion. No focal bony abnormality.  IMPRESSION: No acute disease.   Electronically Signed   By: Inge Rise M.D.   On: 10/11/2013 10:33    Assessment/Plan Principal Problem:   SIRS (systemic inflammatory response syndrome) likely due to UTI  - admit to tele, follow urine culture and sensitivity, blood cultures - place on IV rocephin, follow cultures   Active Problems:   UTI (lower urinary tract infection) - Follow urine culture and sensitivities, continue IV Rocephin    Atrial fibrillation - Currently rate controlled, continue  aspirin    Leukocytosis: Likely due to early sepsis from UTI, continue IV Rocephin and follow cultures    BPH (benign prostatic hypertrophy) - Continue finasteride  DVT prophylaxis: Lovenox  CODE STATUS: DO NOT RESUSCITATE, discussed with the patient  Family Communication: Admission, patients condition and plan of care including tests being ordered have been discussed with the patient and son who indicates understanding and agree with the plan and Code Status   Further plan will depend as patient's clinical course evolves and further radiologic and laboratory data become available.   Time Spent on Admission: 50 mins  Reece Mcbroom M.D. [facility] 10/11/2013, 4:10 PM Pager: [phone]  If 7PM-7AM, please contact night-coverage [email or web address] Password [email or web address]  **Disclaimer: This note was dictated with voice recognition software. Similar sounding words can inadvertently be transcribed and this note may contain transcription errors which may not have been corrected upon publication of note.**

## 2013-10-11 NOTE — ED Notes (Signed)
Pt  Reports    yest  Afternoon   He  Started  Feeling  Bad       With  Fever   And  Stuffy  Nose  He  Had  A  Urinary  Tract    Last  Year           Similar  Symptoms

## 2013-10-11 NOTE — ED Notes (Signed)
Pt took a 200mg  ibuprofen this morning

## 2013-10-11 NOTE — ED Notes (Signed)
Pt sent over from urgent care due to fever, elevated WBC count, and UTI, went there due to fever- pt has needed IV antibiotics in similar situations in the past, took ibuprofen this morning, afebrile in triage

## 2013-10-11 NOTE — Progress Notes (Signed)
PHARMACIST - PHYSICIAN ORDER COMMUNICATION  CONCERNING: P&T Medication Policy on Herbal Medications  DESCRIPTION:  This patient's order for:  Cranberry capsules & Glucosamine-Chondroitin  has been noted.  This product(s) is classified as an "herbal" or natural product. Due to a lack of definitive safety studies or FDA approval, nonstandard manufacturing practices, plus the potential risk of unknown drug-drug interactions while on inpatient medications, the Pharmacy and Therapeutics Committee does not permit the use of "herbal" or natural products of this type within [facility].   ACTION TAKEN: The pharmacy department is unable to verify this order at this time and your patient has been informed of this safety policy. Please reevaluate patient's clinical condition at discharge and address if the herbal or natural product(s) should be resumed at that time.  Alycia Rossetti, PharmD, BCPS Clinical Pharmacist Pager: [phone] 10/11/2013 5:16 PM

## 2013-10-11 NOTE — Progress Notes (Signed)
Patient and son  Arrived to  Unit via  [place] monitor placed Box 14. Pateint made comfortable t bed and oriented to Room

## 2013-10-12 LAB — CBC
HEMATOCRIT: 36.8 % — AB (ref 39.0–52.0)
HEMOGLOBIN: 12.4 g/dL — AB (ref 13.0–17.0)
MCH: 32.5 pg (ref 26.0–34.0)
MCHC: 33.7 g/dL (ref 30.0–36.0)
MCV: 96.3 fL (ref 78.0–100.0)
Platelets: DECREASED 10*3/uL (ref 150–400)
RBC: 3.82 MIL/uL — ABNORMAL LOW (ref 4.22–5.81)
RDW: 13.9 % (ref 11.5–15.5)
WBC: 10 10*3/uL (ref 4.0–10.5)

## 2013-10-12 LAB — BASIC METABOLIC PANEL
Anion gap: 14 (ref 5–15)
BUN: 23 mg/dL (ref 6–23)
CHLORIDE: 101 meq/L (ref 96–112)
CO2: 22 mEq/L (ref 19–32)
CREATININE: 0.78 mg/dL (ref 0.50–1.35)
Calcium: 8.1 mg/dL — ABNORMAL LOW (ref 8.4–10.5)
GFR calc non Af Amer: 77 mL/min — ABNORMAL LOW (ref >90)
GFR, EST AFRICAN AMERICAN: 89 mL/min — AB (ref >90)
Glucose, Bld: 151 mg/dL — ABNORMAL HIGH (ref 70–99)
Potassium: 3.7 mEq/L (ref 3.7–5.3)
Sodium: 137 mEq/L (ref 137–147)

## 2013-10-12 MED ORDER — FUROSEMIDE 40 MG PO TABS
40.0000 mg | ORAL_TABLET | Freq: Two times a day (BID) | ORAL | Status: DC
  Administered 2013-10-12 – 2013-10-14 (×4): 40 mg via ORAL
  Filled 2013-10-12 (×6): qty 1

## 2013-10-12 NOTE — Progress Notes (Addendum)
[facility] Progress Note   Duane Spears [id number] DOB: 1920/11/07 DOA: 10/11/2013 PCP: Criselda Peaches, MD  Brief narrative: Duane Spears is a 80 y.o. male  With CHF, A-fib not on anticoagulation, fever, chills and is found to have  a UTI. Temp was 102.4.    Subjective: No complaints - was having body aches yesterday and feels much better today  Assessment/Plan: Principal Problem:   UTI (lower urinary tract infection) - cont Rocephin and f/u culture  Active Problems:    Atrial fibrillation - rate controlled- on baby ASA at home    BPH (benign prostatic hypertrophy) - Pt denies having a history of BPH although he is taking Proscar and sees a urologist yearly- next appt is later this month.   CHF- unspecified - cont Lasix    Code Status: DNR Family Communication: none Disposition Plan: home in 1-2 days  Consultants: none  Procedures: none  Antibiotics: Anti-infectives   Start     Dose/Rate Route Frequency Ordered Stop   10/12/13 1500  cefTRIAXone (ROCEPHIN) 1 g in dextrose 5 % 50 mL IVPB     1 g 100 mL/hr over 30 Minutes Intravenous Every 24 hours 10/11/13 1703     10/11/13 1415  cefTRIAXone (ROCEPHIN) 1 g in dextrose 5 % 50 mL IVPB     1 g 100 mL/hr over 30 Minutes Intravenous  Once 10/11/13 1410 10/11/13 1505       DVT prophylaxis: Lovenox  Objective: Filed Weights   10/11/13 1700  Weight: 96.163 kg (212 lb)    Intake/Output Summary (Last 24 hours) at 10/12/13 1354 Last data filed at 10/12/13 1300  Gross per 24 hour  Intake    950 ml  Output      0 ml  Net    950 ml     Vitals Filed Vitals:   10/11/13 1700 10/11/13 2150 10/12/13 0500 10/12/13 1341  BP: 137/56 115/68 117/62 130/74  Pulse:  63 63 63  Temp:  98.7 F (37.1 C) 99.4 F (37.4 C) 97.7 F (36.5 C)  TempSrc:  Oral Oral Oral  Resp:  20 18 20   Height: 6\' 1"  (1.854 m)     Weight: 96.163 kg (212 lb)     SpO2: 100% 96% 95% 99%    Exam: General: No acute  respiratory distress Lungs: Clear to auscultation bilaterally without wheezes or crackles Cardiovascular: Regular rate and rhythm without murmur gallop or rub normal S1 and S2 Abdomen: Nontender, nondistended, soft, bowel sounds positive, no rebound, no ascites, no appreciable mass Extremities: No significant cyanosis, clubbing, or edema bilateral lower extremities  Data Reviewed: Basic Metabolic Panel:  Recent Labs Lab 10/11/13 1411 10/12/13 0600  NA 139 137  K 3.8 3.7  CL 99 101  CO2 25 22  GLUCOSE 153* 151*  BUN 29* 23  CREATININE 0.92 0.78  CALCIUM 9.1 8.1*   Liver Function Tests: No results found for this basename: AST, ALT, ALKPHOS, BILITOT, PROT, ALBUMIN,  in the last 168 hours No results found for this basename: LIPASE, AMYLASE,  in the last 168 hours No results found for this basename: AMMONIA,  in the last 168 hours CBC:  Recent Labs Lab 10/11/13 1050 10/12/13 0600  WBC 15.6* 10.0  NEUTROABS 12.8*  --   HGB 13.4 12.4*  HCT 39.7 36.8*  MCV 95.0 96.3  PLT 142* PLATELET CLUMPS NOTED ON SMEAR, COUNT APPEARS DECREASED   Cardiac Enzymes: No results found for this basename: CKTOTAL, CKMB, CKMBINDEX, TROPONINI,  in the last 168 hours BNP (last 3 results) No results found for this basename: PROBNP,  in the last 8760 hours CBG: No results found for this basename: GLUCAP,  in the last 168 hours  No results found for this or any previous visit (from the past 240 hour(s)).   Studies:  Recent x-ray studies have been reviewed in detail by the Attending Physician  Scheduled Meds:  Scheduled Meds: . aspirin EC  81 mg Oral Daily  . cefTRIAXone (ROCEPHIN)  IV  1 g Intravenous Q24H  . enoxaparin (LOVENOX) injection  40 mg Subcutaneous Q24H  . finasteride  5 mg Oral Daily  . multivitamin  5 mL Oral Daily   Continuous Infusions:   Time spent on care of this patient: 30 min   [place], MD 10/12/2013, 1:54 PM  LOS: 1 day   [facility] Office   [phone] Pager - Text Page per [email or web address]  If 7PM-7AM, please contact night-coverage [email or web address]

## 2013-10-12 NOTE — Plan of Care (Signed)
Problem: Phase I Progression Outcomes Goal: Hemodynamically stable Outcome: Progressing Low grade fever assessed this am, otherwise has been afebrile.  BP/HR stable for patient.  Continues on IV Rocephin for treatment.  Will continue to monitor patient condition.

## 2013-10-13 DIAGNOSIS — A419 Sepsis, unspecified organism: Secondary | ICD-10-CM

## 2013-10-13 DIAGNOSIS — A4150 Gram-negative sepsis, unspecified: Secondary | ICD-10-CM

## 2013-10-13 MED ORDER — LEVOFLOXACIN 250 MG PO TABS
250.0000 mg | ORAL_TABLET | Freq: Every day | ORAL | Status: DC
  Administered 2013-10-13 – 2013-10-14 (×2): 250 mg via ORAL
  Filled 2013-10-13 (×3): qty 1

## 2013-10-13 NOTE — Progress Notes (Signed)
[facility] Progress Note   Duane Spears [id number] DOB: 1920-03-16 DOA: 10/11/2013 PCP: Criselda Peaches, MD  Brief narrative: Duane Spears is a 80 y.o. male  With CHF, A-fib not on anticoagulation, fever, chills and is found to have  a UTI. Temp was 102.4.    Subjective: Continues to do well. No cough, dyspnea, nausea,vomiting, constipation, diarrhea. Eating well.   Assessment/Plan: Principal Problem:   UTI (lower urinary tract infection) - Switch to Levaquin - hopefully can go home with this therefore will see if he can tolerate it.  Culture reveals Gr neg rods  Active Problems:    Atrial fibrillation - rate controlled- on baby ASA at home    BPH (benign prostatic hypertrophy) - Pt denies having a history of BPH although he is taking Proscar and sees a urologist yearly- next appt is later this month.   CHF- unspecified - cont Lasix    Code Status: DNR Family Communication: none Disposition Plan: home in 1-2 days  Consultants: none  Procedures: none  Antibiotics: Anti-infectives   Start     Dose/Rate Route Frequency Ordered Stop   10/13/13 1000  levofloxacin (LEVAQUIN) tablet 250 mg     250 mg Oral Daily 10/13/13 0848     10/12/13 1500  cefTRIAXone (ROCEPHIN) 1 g in dextrose 5 % 50 mL IVPB  Status:  Discontinued     1 g 100 mL/hr over 30 Minutes Intravenous Every 24 hours 10/11/13 1703 10/13/13 0848   10/11/13 1415  cefTRIAXone (ROCEPHIN) 1 g in dextrose 5 % 50 mL IVPB     1 g 100 mL/hr over 30 Minutes Intravenous  Once 10/11/13 1410 10/11/13 1505       DVT prophylaxis: Lovenox  Objective: Filed Weights   10/11/13 1700  Weight: 96.163 kg (212 lb)    Intake/Output Summary (Last 24 hours) at 10/13/13 1036 Last data filed at 10/13/13 0836  Gross per 24 hour  Intake    440 ml  Output    425 ml  Net     15 ml     Vitals Filed Vitals:   10/12/13 0500 10/12/13 1341 10/12/13 2151 10/13/13 0505  BP: 117/62 130/74 131/55 134/59  Pulse:  63 63 72 57  Temp: 99.4 F (37.4 C) 97.7 F (36.5 C) 98.8 F (37.1 C) 98.6 F (37 C)  TempSrc: Oral Oral Oral Oral  Resp: 18 20 20 20   Height:      Weight:      SpO2: 95% 99% 95% 99%    Exam: General: No acute respiratory distress Lungs: Clear to auscultation bilaterally without wheezes or crackles Cardiovascular: Regular rate and rhythm without murmur gallop or rub normal S1 and S2 Abdomen: Nontender, nondistended, soft, bowel sounds positive, no rebound, no ascites, no appreciable mass Extremities: No significant cyanosis, clubbing, or edema bilateral lower extremities  Data Reviewed: Basic Metabolic Panel:  Recent Labs Lab 10/11/13 1411 10/12/13 0600  NA 139 137  K 3.8 3.7  CL 99 101  CO2 25 22  GLUCOSE 153* 151*  BUN 29* 23  CREATININE 0.92 0.78  CALCIUM 9.1 8.1*   Liver Function Tests: No results found for this basename: AST, ALT, ALKPHOS, BILITOT, PROT, ALBUMIN,  in the last 168 hours No results found for this basename: LIPASE, AMYLASE,  in the last 168 hours No results found for this basename: AMMONIA,  in the last 168 hours CBC:  Recent Labs Lab 10/11/13 1050 10/12/13 0600  WBC 15.6* 10.0  NEUTROABS  12.8*  --   HGB 13.4 12.4*  HCT 39.7 36.8*  MCV 95.0 96.3  PLT 142* PLATELET CLUMPS NOTED ON SMEAR, COUNT APPEARS DECREASED   Cardiac Enzymes: No results found for this basename: CKTOTAL, CKMB, CKMBINDEX, TROPONINI,  in the last 168 hours BNP (last 3 results) No results found for this basename: PROBNP,  in the last 8760 hours CBG: No results found for this basename: GLUCAP,  in the last 168 hours  Recent Results (from the past 240 hour(s))  URINE CULTURE     Status: None   Collection Time    10/11/13 10:42 AM      Result Value Ref Range Status   Specimen Description URINE, CLEAN CATCH   Final   Special Requests Normal   Final   Culture  Setup Time     Final   Value: 10/11/2013 17:14     Performed at [place]     Final    Value: >=100,000 COLONIES/ML     Performed at [facility]   Culture     Final   Value: [place]     Performed at [facility]   Report Status PENDING   Incomplete  CULTURE, BLOOD (ROUTINE X 2)     Status: None   Collection Time    10/11/13  2:30 PM      Result Value Ref Range Status   Specimen Description BLOOD RIGHT FOREARM   Final   Special Requests BOTTLES DRAWN AEROBIC AND ANAEROBIC 3CC   Final   Culture  Setup Time     Final   Value: 10/11/2013 19:18     Performed at [facility]   Culture     Final   Value:        BLOOD CULTURE RECEIVED NO GROWTH TO DATE CULTURE WILL BE HELD FOR 5 DAYS BEFORE ISSUING A FINAL NEGATIVE REPORT     Performed at [facility]   Report Status PENDING   Incomplete  CULTURE, BLOOD (ROUTINE X 2)     Status: None   Collection Time    10/11/13  3:20 PM      Result Value Ref Range Status   Specimen Description BLOOD LEFT HAND   Final   Special Requests BOTTLES DRAWN AEROBIC AND ANAEROBIC [place]   Final   Culture  Setup Time     Final   Value: 10/11/2013 19:18     Performed at [facility]   Culture     Final   Value:        BLOOD CULTURE RECEIVED NO GROWTH TO DATE CULTURE WILL BE HELD FOR 5 DAYS BEFORE ISSUING A FINAL NEGATIVE REPORT     Performed at [facility]   Report Status PENDING   Incomplete     Studies:  Recent x-ray studies have been reviewed in detail by the Attending Physician  Scheduled Meds:  Scheduled Meds: . aspirin EC  81 mg Oral Daily  . enoxaparin (LOVENOX) injection  40 mg Subcutaneous Q24H  . finasteride  5 mg Oral Daily  . furosemide  40 mg Oral BID  . levofloxacin  250 mg Oral Daily  . multivitamin  5 mL Oral Daily   Continuous Infusions:   Time spent on care of this patient: 20 min   [place], MD 10/13/2013, 10:36 AM  LOS: 2 days   [facility] Office  [phone] Pager - Text Page per [email or web address]  If  7PM-7AM, please contact  night-coverage [email or web address]

## 2013-10-14 DIAGNOSIS — B965 Pseudomonas (aeruginosa) (mallei) (pseudomallei) as the cause of diseases classified elsewhere: Secondary | ICD-10-CM

## 2013-10-14 LAB — URINE CULTURE
Colony Count: >=100000
Special Requests: NORMAL

## 2013-10-14 MED ORDER — LEVOFLOXACIN 250 MG PO TABS: 250.0000 mg | ORAL_TABLET | Freq: Every day | ORAL | Status: AC

## 2013-10-14 NOTE — Discharge Summary (Signed)
Physician Discharge Summary  Duane Spears [id number] DOB: [birth year] DOA: 10/11/2013  PCP: Criselda Peaches, MD  Admit date: 10/11/2013 Discharge date: 10/14/2013  Time spent: >45 minutes    Discharge Diagnoses:  Principal Problem:   Pseudomonas infection Active Problems:   UTI (lower urinary tract infection)   Atrial fibrillation   Leukocytosis   BPH (benign prostatic hypertrophy)   Discharge Condition: stable  Diet recommendation: heart healthy  Filed Weights   10/11/13 1700  Weight: 96.163 kg (212 lb)    History of present illness:  Patient is a 80 year old male with history of CHF, BPH, atrial fibrillation not on anticoagulation was sent from the urgent care for impending sepsis, UTI. History was obtained from the patient and his son in the room. Per patient, he started having fevers, chills and generalized weakness, malaise on the day before admission. At the time of arrival to the urgent care temp was 102.4. Patient had a prior history of UTI 2 years ago when he required IV antibiotics and hospitalization hence due to the same concern, he came to the urgent care today.  ER workup shows white count of 15.6, UA positive for UTI, lactic acid 1.69   Hospital Course:  Principal Problem:  Pseudomonas UTI - Switched to Levaquin on 8/2-  Culture reveals pseudomonas sensitive to Levaquin per verbal report by the lab- will d/c home with Levaquin for a total of 7 days starting yesterday.   Active Problems:  Atrial fibrillation  - rate controlled- on baby ASA at home   BPH (benign prostatic hypertrophy)  - Pt denies having a history of BPH although he is taking Proscar and sees a urologist yearly- next appt is later this month.   CHF- unspecified  - cont Lasix   Procedures:  none  Consultations:  none  Discharge Exam: Filed Vitals:   10/14/13 0507  BP: 144/81  Pulse: 60  Temp: 98.2 F (36.8 C)  Resp: 12    General: No acute respiratory distress  Lungs:  Clear to auscultation bilaterally without wheezes or crackles  Cardiovascular: Regular rate and rhythm without murmur gallop or rub normal S1 and S2  Abdomen: Nontender, nondistended, soft, bowel sounds positive, no rebound, no ascites, no appreciable mass  Extremities: No significant cyanosis, clubbing, or edema bilateral lower extremities   Discharge Instructions You were cared for by a hospitalist during your hospital stay. If you have any questions about your discharge medications or the care you received while you were in the hospital after you are discharged, you can call the unit and asked to speak with the hospitalist on call if the hospitalist that took care of you is not available. Once you are discharged, your primary care physician will handle any further medical issues. Please note that NO REFILLS for any discharge medications will be authorized once you are discharged, as it is imperative that you return to your primary care physician (or establish a relationship with a primary care physician if you do not have one) for your aftercare needs so that they can reassess your need for medications and monitor your lab values.      Discharge Instructions   Diet - low sodium heart healthy    Complete by:  As directed      Increase activity slowly    Complete by:  As directed             Medication List         aspirin 81 MG tablet  Take 81 mg by mouth daily.     Cranberry 400 MG Caps  Take 400 mg by mouth 2 (two) times daily.     finasteride 5 MG tablet  Commonly known as:  PROSCAR  Take 5 mg by mouth daily.     furosemide 20 MG tablet  Commonly known as:  LASIX  Take 20-60 mg by mouth 2 (two) times daily. 60mg  in AM and 20mg  in PM     glucosamine-chondroitin 500-400 MG tablet  Take 1 tablet by mouth daily.     levofloxacin 250 MG tablet  Commonly known as:  LEVAQUIN  Take 1 tablet (250 mg total) by mouth daily.     multivitamin with iron-minerals liquid  Take by  mouth daily.       No Known Allergies Follow-up Information   Follow up with GREEN, Keenan Bachelor, MD On 10/31/2013. (3:15 for follow up with pcp)    Specialty:  Internal Medicine   Contact information:   [address] [place]  [address] [phone]        The results of significant diagnostics from this hospitalization (including imaging, microbiology, ancillary and laboratory) are listed below for reference.    Significant Diagnostic Studies: Dg Chest 2 View  10/11/2013   CLINICAL DATA:  Fever.  EXAM: CHEST  2 VIEW  COMPARISON:  PA and lateral chest 10/04/2010.  FINDINGS: The lungs are clear. Heart size is mildly enlarged. No pneumothorax or pleural effusion. No focal bony abnormality.  IMPRESSION: No acute disease.   Electronically Signed   By: Inge Rise M.D.   On: 10/11/2013 10:33    Microbiology: Recent Results (from the past 240 hour(s))  URINE CULTURE     Status: None   Collection Time    10/11/13 10:42 AM      Result Value Ref Range Status   Specimen Description URINE, CLEAN CATCH   Final   Special Requests Normal   Final   Culture  Setup Time     Final   Value: 10/11/2013 17:14     Performed at [facility]     Final   Value: >=100,000 COLONIES/ML     Performed at [facility]   Culture     Final   Value: PSEUDOMONAS AERUGINOSA     Performed at [facility]   Report Status 10/14/2013 FINAL   Final   Organism ID, Bacteria PSEUDOMONAS AERUGINOSA   Final  CULTURE, BLOOD (ROUTINE X 2)     Status: None   Collection Time    10/11/13  2:30 PM      Result Value Ref Range Status   Specimen Description BLOOD RIGHT FOREARM   Final   Special Requests BOTTLES DRAWN AEROBIC AND ANAEROBIC 3CC   Final   Culture  Setup Time     Final   Value: 10/11/2013 19:18     Performed at [facility]   Culture     Final   Value:        BLOOD CULTURE RECEIVED NO GROWTH TO DATE CULTURE WILL BE HELD FOR 5 DAYS BEFORE  ISSUING A FINAL NEGATIVE REPORT     Performed at [facility]   Report Status PENDING   Incomplete  CULTURE, BLOOD (ROUTINE X 2)     Status: None   Collection Time    10/11/13  3:20 PM      Result Value Ref Range Status   Specimen Description  BLOOD LEFT HAND   Final   Special Requests BOTTLES DRAWN AEROBIC AND ANAEROBIC 6CC   Final   Culture  Setup Time     Final   Value: 10/11/2013 19:18     Performed at [facility]   Culture     Final   Value:        BLOOD CULTURE RECEIVED NO GROWTH TO DATE CULTURE WILL BE HELD FOR 5 DAYS BEFORE ISSUING A FINAL NEGATIVE REPORT     Performed at [facility]   Report Status PENDING   Incomplete     Labs: Basic Metabolic Panel:  Recent Labs Lab 10/11/13 1411 10/12/13 0600  NA 139 137  K 3.8 3.7  CL 99 101  CO2 25 22  GLUCOSE 153* 151*  BUN 29* 23  CREATININE 0.92 0.78  CALCIUM 9.1 8.1*   Liver Function Tests: No results found for this basename: AST, ALT, ALKPHOS, BILITOT, PROT, ALBUMIN,  in the last 168 hours No results found for this basename: LIPASE, AMYLASE,  in the last 168 hours No results found for this basename: AMMONIA,  in the last 168 hours CBC:  Recent Labs Lab 10/11/13 1050 10/12/13 0600  WBC 15.6* 10.0  NEUTROABS 12.8*  --   HGB 13.4 12.4*  HCT 39.7 36.8*  MCV 95.0 96.3  PLT 142* PLATELET CLUMPS NOTED ON SMEAR, COUNT APPEARS DECREASED   Cardiac Enzymes: No results found for this basename: CKTOTAL, CKMB, CKMBINDEX, TROPONINI,  in the last 168 hours BNP: BNP (last 3 results) No results found for this basename: PROBNP,  in the last 8760 hours CBG: No results found for this basename: GLUCAP,  in the last 168 hours     Signed:  Debbe Odea, MD [facility] 10/14/2013, 11:57 AM

## 2013-10-14 NOTE — Progress Notes (Signed)
UR completed Brently Voorhis K. Demond Shallenberger, RN, BSN, [place], CCM  10/14/2013 11:53 AM

## 2013-10-14 NOTE — Progress Notes (Signed)
Duane Spears to be D/C'd Home per MD order.  Discussed with the patient and all questions fully answered.    Medication List         aspirin 81 MG tablet  Take 81 mg by mouth daily.     Cranberry 400 MG Caps  Take 400 mg by mouth 2 (two) times daily.     finasteride 5 MG tablet  Commonly known as:  PROSCAR  Take 5 mg by mouth daily.     furosemide 20 MG tablet  Commonly known as:  LASIX  Take 20-60 mg by mouth 2 (two) times daily. 60mg  in AM and 20mg  in PM     glucosamine-chondroitin 500-400 MG tablet  Take 1 tablet by mouth daily.     levofloxacin 250 MG tablet  Commonly known as:  LEVAQUIN  Take 1 tablet (250 mg total) by mouth daily.     multivitamin with iron-minerals liquid  Take by mouth daily.        VVS, Skin clean, dry and intact without evidence of skin break down, no evidence of skin tears noted. IV catheter discontinued intact. Site without signs and symptoms of complications. Dressing and pressure applied.  An After Visit Summary was printed and given to the patient.  D/c education completed with patient/family including follow up instructions, medication list, d/c activities limitations if indicated, with other d/c instructions as indicated by MD - patient able to verbalize understanding, all questions fully answered.   Patient instructed to return to ED, call 911, or call MD for any changes in condition.   Patient escorted via [facility] with son, [name], and D/C home via private auto.  [name] D 10/14/2013 12:56 PM

## 2013-10-14 NOTE — Care Management Note (Addendum)
    Page 1 of 1   10/14/2013     3:56:13 PM CARE MANAGEMENT NOTE 10/14/2013  Patient:  Duane Spears, Duane Spears   Account Number:  [id number]  Date Initiated:  10/14/2013  Documentation initiated by:  HUTCHINSON,CRYSTAL  Subjective/Objective Assessment:   Fever, chills and malaise started yesterday     Action/Plan:   CM to follow for dispositon needs   Anticipated DC Date:  10/17/2013   Anticipated DC Plan:  HOME/SELF CARE         Choice offered to / List presented to:             Status of service:  Completed, signed off Medicare Important Message given?  YES (If response is "NO", the following Medicare IM given date fields will be blank) Date Medicare IM given:  10/14/2013 Medicare IM given by:  Tomi Bamberger Date Additional Medicare IM given:   Additional Medicare IM given by:    Discharge Disposition:  HOME/SELF CARE  Per UR Regulation:  Reviewed for med. necessity/level of care/duration of stay  If discussed at [place] of Stay Meetings, dates discussed:    Comments:  10/14/13 [place], BSN [phone] patient for dc today, no needs antiicpated.  Crystal Hutchinson RN, BSN, MSHL, CCM  Nurse - Case Manager,  [phone]  10/14/2013   SIRS (systemic inflammatory response syndrome) likely due to UTI  -  urine culture and sensitivity, blood cultures - IV rocephin, follow cultures Disposition Plan:  pending

## 2013-10-15 NOTE — ED Notes (Addendum)
Urine culture: >100,000 colonies pseudomonas aeruginosa.  Pt. transferred to ED and got Rocephin 1 gm IV and Levaquin.  Labs shown to Dr. Juventino Slovak and he said no further action needed.  Pt. was admitted and has a PCP.  Roselyn Meier 10/15/2013

## 2013-10-17 LAB — CULTURE, BLOOD (ROUTINE X 2)
CULTURE: NO GROWTH
Culture: NO GROWTH

## 2015-04-05 ENCOUNTER — Encounter (HOSPITAL_COMMUNITY): Payer: Self-pay | Admitting: Emergency Medicine

## 2015-04-05 ENCOUNTER — Emergency Department (HOSPITAL_COMMUNITY)
Admission: EM | Admit: 2015-04-05 | Discharge: 2015-04-05 | Disposition: A | Discharge status: Home / Self Care | Payer: PPO | Attending: Emergency Medicine | Admitting: Emergency Medicine

## 2015-04-05 DIAGNOSIS — Y9389 Activity, other specified: Secondary | ICD-10-CM | POA: Diagnosis not present

## 2015-04-05 DIAGNOSIS — X58XXXA Exposure to other specified factors, initial encounter: Secondary | ICD-10-CM | POA: Insufficient documentation

## 2015-04-05 DIAGNOSIS — K222 Esophageal obstruction: Secondary | ICD-10-CM

## 2015-04-05 DIAGNOSIS — I509 Heart failure, unspecified: Secondary | ICD-10-CM | POA: Insufficient documentation

## 2015-04-05 DIAGNOSIS — Y998 Other external cause status: Secondary | ICD-10-CM | POA: Insufficient documentation

## 2015-04-05 DIAGNOSIS — T18128A Food in esophagus causing other injury, initial encounter: Secondary | ICD-10-CM | POA: Insufficient documentation

## 2015-04-05 DIAGNOSIS — N429 Disorder of prostate, unspecified: Secondary | ICD-10-CM | POA: Diagnosis not present

## 2015-04-05 DIAGNOSIS — Z9049 Acquired absence of other specified parts of digestive tract: Secondary | ICD-10-CM | POA: Diagnosis not present

## 2015-04-05 DIAGNOSIS — T18120A Food in esophagus causing compression of trachea, initial encounter: Secondary | ICD-10-CM | POA: Diagnosis not present

## 2015-04-05 DIAGNOSIS — Y9289 Other specified places as the place of occurrence of the external cause: Secondary | ICD-10-CM | POA: Diagnosis not present

## 2015-04-05 DIAGNOSIS — Z7982 Long term (current) use of aspirin: Secondary | ICD-10-CM | POA: Diagnosis not present

## 2015-04-05 DIAGNOSIS — Z79899 Other long term (current) drug therapy: Secondary | ICD-10-CM | POA: Insufficient documentation

## 2015-04-05 DIAGNOSIS — R1013 Epigastric pain: Secondary | ICD-10-CM | POA: Diagnosis not present

## 2015-04-05 HISTORY — DX: Unspecified atrial fibrillation: I48.91

## 2015-04-05 HISTORY — DX: Bradycardia, unspecified: R00.1

## 2015-04-05 LAB — URINALYSIS, ROUTINE W REFLEX MICROSCOPIC
BILIRUBIN URINE: NEGATIVE
GLUCOSE, UA: NEGATIVE mg/dL
HGB URINE DIPSTICK: NEGATIVE
Ketones, ur: NEGATIVE mg/dL
Leukocytes, UA: NEGATIVE
Nitrite: NEGATIVE
PH: 5 (ref 5.0–8.0)
Protein, ur: NEGATIVE mg/dL
SPECIFIC GRAVITY, URINE: 1.017 (ref 1.005–1.030)

## 2015-04-05 LAB — COMPREHENSIVE METABOLIC PANEL
ALT: 19 U/L (ref 17–63)
ANION GAP: 13 (ref 5–15)
AST: 25 U/L (ref 15–41)
Albumin: 3.9 g/dL (ref 3.5–5.0)
Alkaline Phosphatase: 64 U/L (ref 38–126)
BILIRUBIN TOTAL: 1 mg/dL (ref 0.3–1.2)
BUN: 27 mg/dL — AB (ref 6–20)
CHLORIDE: 106 mmol/L (ref 101–111)
CO2: 25 mmol/L (ref 22–32)
Calcium: 9.2 mg/dL (ref 8.9–10.3)
Creatinine, Ser: 1.05 mg/dL (ref 0.61–1.24)
GFR calc non Af Amer: 59 mL/min — ABNORMAL LOW (ref >60)
Glucose, Bld: 180 mg/dL — ABNORMAL HIGH (ref 65–99)
POTASSIUM: 4.4 mmol/L (ref 3.5–5.1)
Sodium: 144 mmol/L (ref 135–145)
TOTAL PROTEIN: 6.6 g/dL (ref 6.5–8.1)

## 2015-04-05 LAB — CBC
HEMATOCRIT: 44.3 % (ref 39.0–52.0)
Hemoglobin: 14.8 g/dL (ref 13.0–17.0)
MCH: 32.4 pg (ref 26.0–34.0)
MCHC: 33.4 g/dL (ref 30.0–36.0)
MCV: 96.9 fL (ref 78.0–100.0)
Platelets: 117 10*3/uL — ABNORMAL LOW (ref 150–400)
RBC: 4.57 MIL/uL (ref 4.22–5.81)
RDW: 14.3 % (ref 11.5–15.5)
WBC: 7.3 10*3/uL (ref 4.0–10.5)

## 2015-04-05 LAB — LIPASE, BLOOD: LIPASE: 70 U/L — AB (ref 11–51)

## 2015-04-05 MED ORDER — LORAZEPAM 2 MG/ML IJ SOLN
0.5000 mg | Freq: Once | INTRAMUSCULAR | Status: AC
  Administered 2015-04-05: 0.5 mg via INTRAVENOUS
  Filled 2015-04-05: qty 1

## 2015-04-05 MED ORDER — SODIUM CHLORIDE 0.9 % IV SOLN
INTRAVENOUS | Status: DC
  Administered 2015-04-05: 21:00:00 via INTRAVENOUS

## 2015-04-05 MED ORDER — GLUCAGON HCL RDNA (DIAGNOSTIC) 1 MG IJ SOLR
1.0000 mg | Freq: Once | INTRAMUSCULAR | Status: AC
  Administered 2015-04-05: 1 mg via INTRAVENOUS
  Filled 2015-04-05: qty 1

## 2015-04-05 NOTE — ED Provider Notes (Signed)
CSN: [id number]     Arrival date & time 04/05/15  1352 History   First MD Initiated Contact with Patient 04/05/15 1802     Chief Complaint  Patient presents with  . Abdominal Pain   HPI Patient presents with concern of retained foreign body. Soon after eating a piece of roast beef, patient felt a choking sensation. She'll go past his epigastrium. Since that time he has had inability to swallow saliva, persistent discomfort in the midline upper chest, with an ascending discomfort. No relief with Heimlich maneuver. No syncope, lightheadedness, other chest pain, dyspnea, difficult speaking. Patient was well prior to the event.  Past Medical History  Diagnosis Date  . CHF (congestive heart failure) ([place])   . Prostate disorder   . Bradycardia   . A-fib [facility])    Past Surgical History  Procedure Laterality Date  . Appendectomy    . Hip surgery    . Colonoscopy     No family history on file. Social History  Substance Use Topics  . Smoking status: Never Smoker   . Smokeless tobacco: None  . Alcohol Use: No    Review of Systems  Constitutional:       Per HPI, otherwise negative  HENT:       Per HPI, otherwise negative  Respiratory:       Per HPI, otherwise negative  Cardiovascular:       Per HPI, otherwise negative  Gastrointestinal: Negative for vomiting.  Endocrine:       Negative aside from HPI  Genitourinary:       Neg aside from HPI   Musculoskeletal:       Per HPI, otherwise negative  Skin: Negative.   Neurological: Negative for syncope.      Allergies  Review of patient's allergies indicates no known allergies.  Home Medications   Prior to Admission medications   Medication Sig Start Date End Date Taking? Authorizing Provider  aspirin 81 MG tablet Take 81 mg by mouth daily.   Yes Historical Provider, MD  finasteride (PROSCAR) 5 MG tablet Take 5 mg by mouth daily.   Yes Historical Provider, MD  furosemide (LASIX) 20 MG tablet Take 20-60 mg by mouth 2  (two) times daily. 60mg  in AM and 20mg  in PM   Yes Historical Provider, MD  Multiple Vitamin (MULTIVITAMIN WITH MINERALS) TABS tablet Take 1 tablet by mouth daily.   Yes Historical Provider, MD  Multiple Vitamins-Minerals (PRESERVISION AREDS 2) CAPS Take 1 capsule by mouth 2 (two) times daily.   Yes Historical Provider, MD  THIAMINE HCL PO Take 1 tablet by mouth daily.   Yes Historical Provider, MD  Cranberry 400 MG CAPS Take 400 mg by mouth 2 (two) times daily.    Historical Provider, MD  glucosamine-chondroitin 500-400 MG tablet Take 1 tablet by mouth daily.    Historical Provider, MD  Multiple Vitamins-Minerals (MULTIVITAMIN WITH IRON-MINERALS) liquid Take by mouth daily.    Historical Provider, MD   BP 158/96 mmHg  Pulse 61  Temp(Src) 98.1 F (36.7 C) (Oral)  Resp 16  SpO2 100% Physical Exam  Constitutional: He is oriented to person, place, and time. He appears well-developed. No distress.  HENT:  Head: Normocephalic and atraumatic.  Eyes: Conjunctivae and EOM are normal.  Cardiovascular: Normal rate and regular rhythm.   Pulmonary/Chest: Effort normal. No stridor. No respiratory distress.  Abdominal: He exhibits no distension.  Minimal discomfort about the epigastrium  Musculoskeletal: He exhibits no edema.  Neurological: He is  alert and oriented to person, place, and time.  Skin: Skin is warm and dry.  Psychiatric: He has a normal mood and affect.  Nursing note and vitals reviewed.   ED Course  Procedures (including critical care time) Labs Review Labs Reviewed  LIPASE, BLOOD - Abnormal; Notable for the following:    Lipase 70 (*)    All other components within normal limits  COMPREHENSIVE METABOLIC PANEL - Abnormal; Notable for the following:    Glucose, Bld 180 (*)    BUN 27 (*)    GFR calc non Af Amer 59 (*)    All other components within normal limits  CBC - Abnormal; Notable for the following:    Platelets 117 (*)    All other components within normal limits   URINALYSIS, ROUTINE W REFLEX MICROSCOPIC (NOT AT [facility])    9:37 PM Patient now tolerant of oral intake, oral secretions. Again I discussed all findings the patient and his son. With no recent GI evaluation, the patient will be discharged, and stable condition to follow-up with primary care and gastroenterology.  MDM   Patient presents with likely esophageal food bolus. Initially the patient is unable to tolerate his own saliva. However, the patient improved here after provision of Ativan, glucagon. After some time of monitoring, with no return of symptoms, and with demonstrated tolerance of oral intake, the patient was discharged in stable condition. She does have mild laboratory abnormalities, but no sustained epigastric discomfort, no rebound, guarding. Patient is already scheduled follow-up with primary care in 3 days, will follow up with gastroenterology as well.  Carmin Muskrat, MD 04/05/15 2138

## 2015-04-05 NOTE — Discharge Instructions (Signed)
As discussed, it is very important you monitor your condition carefully, and if you have additional difficulty swallowing your secretions, or liquids, return to emergency department. Otherwise, please be sure to follow up with your primary care physician and our gastroenterology colleagues.

## 2015-04-05 NOTE — ED Notes (Signed)
Pt c/o ate roast beef about 3 hours ago and feels like food is stuck in epigastric area. Family has tried heimlich without relief. Pt denies shortness of breath.

## 2015-04-09 DIAGNOSIS — L97909 Non-pressure chronic ulcer of unspecified part of unspecified lower leg with unspecified severity: Secondary | ICD-10-CM | POA: Diagnosis not present

## 2015-04-09 DIAGNOSIS — K224 Dyskinesia of esophagus: Secondary | ICD-10-CM | POA: Diagnosis not present

## 2015-07-13 DIAGNOSIS — E1165 Type 2 diabetes mellitus with hyperglycemia: Secondary | ICD-10-CM | POA: Diagnosis not present

## 2015-07-13 DIAGNOSIS — M1990 Unspecified osteoarthritis, unspecified site: Secondary | ICD-10-CM | POA: Diagnosis not present

## 2015-07-13 DIAGNOSIS — I4891 Unspecified atrial fibrillation: Secondary | ICD-10-CM | POA: Diagnosis not present

## 2015-08-20 DIAGNOSIS — R509 Fever, unspecified: Secondary | ICD-10-CM | POA: Diagnosis not present

## 2015-08-20 DIAGNOSIS — E1165 Type 2 diabetes mellitus with hyperglycemia: Secondary | ICD-10-CM | POA: Diagnosis not present

## 2015-08-20 DIAGNOSIS — I119 Hypertensive heart disease without heart failure: Secondary | ICD-10-CM | POA: Diagnosis not present

## 2015-09-25 DIAGNOSIS — N390 Urinary tract infection, site not specified: Secondary | ICD-10-CM | POA: Diagnosis not present

## 2015-10-16 DIAGNOSIS — N390 Urinary tract infection, site not specified: Secondary | ICD-10-CM | POA: Diagnosis not present

## 2015-12-01 DIAGNOSIS — N200 Calculus of kidney: Secondary | ICD-10-CM | POA: Diagnosis not present

## 2015-12-01 DIAGNOSIS — E1165 Type 2 diabetes mellitus with hyperglycemia: Secondary | ICD-10-CM | POA: Diagnosis not present

## 2015-12-01 DIAGNOSIS — Z23 Encounter for immunization: Secondary | ICD-10-CM | POA: Diagnosis not present

## 2015-12-01 DIAGNOSIS — Z0000 Encounter for general adult medical examination without abnormal findings: Secondary | ICD-10-CM | POA: Diagnosis not present

## 2015-12-01 DIAGNOSIS — I4891 Unspecified atrial fibrillation: Secondary | ICD-10-CM | POA: Diagnosis not present

## 2015-12-01 DIAGNOSIS — N400 Enlarged prostate without lower urinary tract symptoms: Secondary | ICD-10-CM | POA: Diagnosis not present

## 2016-01-06 DIAGNOSIS — H353132 Nonexudative age-related macular degeneration, bilateral, intermediate dry stage: Secondary | ICD-10-CM | POA: Diagnosis not present

## 2016-03-01 DIAGNOSIS — E1165 Type 2 diabetes mellitus with hyperglycemia: Secondary | ICD-10-CM | POA: Diagnosis not present

## 2016-03-01 DIAGNOSIS — I4891 Unspecified atrial fibrillation: Secondary | ICD-10-CM | POA: Diagnosis not present

## 2016-03-29 ENCOUNTER — Ambulatory Visit (HOSPITAL_COMMUNITY)
Admission: RE | Admit: 2016-03-29 | Discharge: 2016-03-29 | Disposition: A | Discharge status: Home / Self Care | Payer: PPO | Source: Ambulatory Visit | Attending: Internal Medicine | Admitting: Internal Medicine

## 2016-03-29 ENCOUNTER — Other Ambulatory Visit (HOSPITAL_COMMUNITY): Payer: Self-pay | Admitting: Internal Medicine

## 2016-03-29 ENCOUNTER — Other Ambulatory Visit: Payer: Self-pay | Admitting: Internal Medicine

## 2016-03-29 DIAGNOSIS — I861 Scrotal varices: Secondary | ICD-10-CM | POA: Diagnosis not present

## 2016-03-29 DIAGNOSIS — B999 Unspecified infectious disease: Secondary | ICD-10-CM

## 2016-03-29 DIAGNOSIS — K4090 Unilateral inguinal hernia, without obstruction or gangrene, not specified as recurrent: Secondary | ICD-10-CM | POA: Diagnosis not present

## 2016-03-29 DIAGNOSIS — N50819 Testicular pain, unspecified: Secondary | ICD-10-CM

## 2016-03-29 DIAGNOSIS — N433 Hydrocele, unspecified: Secondary | ICD-10-CM | POA: Diagnosis not present

## 2016-03-29 DIAGNOSIS — N5089 Other specified disorders of the male genital organs: Secondary | ICD-10-CM | POA: Diagnosis not present

## 2016-04-01 DIAGNOSIS — N451 Epididymitis: Secondary | ICD-10-CM | POA: Diagnosis not present

## 2016-04-29 DIAGNOSIS — S5010XA Contusion of unspecified forearm, initial encounter: Secondary | ICD-10-CM | POA: Diagnosis not present

## 2016-04-29 DIAGNOSIS — N451 Epididymitis: Secondary | ICD-10-CM | POA: Diagnosis not present

## 2016-05-12 DIAGNOSIS — E1165 Type 2 diabetes mellitus with hyperglycemia: Secondary | ICD-10-CM | POA: Diagnosis not present

## 2016-05-12 DIAGNOSIS — K625 Hemorrhage of anus and rectum: Secondary | ICD-10-CM | POA: Diagnosis not present

## 2016-05-12 DIAGNOSIS — D559 Anemia due to enzyme disorder, unspecified: Secondary | ICD-10-CM | POA: Diagnosis not present

## 2016-05-13 DIAGNOSIS — K922 Gastrointestinal hemorrhage, unspecified: Secondary | ICD-10-CM | POA: Diagnosis not present

## 2016-05-13 DIAGNOSIS — K625 Hemorrhage of anus and rectum: Secondary | ICD-10-CM | POA: Diagnosis not present

## 2016-06-03 DIAGNOSIS — R799 Abnormal finding of blood chemistry, unspecified: Secondary | ICD-10-CM | POA: Diagnosis not present

## 2016-06-03 DIAGNOSIS — N451 Epididymitis: Secondary | ICD-10-CM | POA: Diagnosis not present

## 2016-06-03 DIAGNOSIS — D559 Anemia due to enzyme disorder, unspecified: Secondary | ICD-10-CM | POA: Diagnosis not present

## 2016-06-03 DIAGNOSIS — K5753 Diverticulitis of both small and large intestine without perforation or abscess with bleeding: Secondary | ICD-10-CM | POA: Diagnosis not present

## 2016-08-25 DIAGNOSIS — I4891 Unspecified atrial fibrillation: Secondary | ICD-10-CM | POA: Diagnosis not present

## 2016-08-25 DIAGNOSIS — I5020 Unspecified systolic (congestive) heart failure: Secondary | ICD-10-CM | POA: Diagnosis not present

## 2016-08-25 DIAGNOSIS — R0902 Hypoxemia: Secondary | ICD-10-CM | POA: Diagnosis not present

## 2016-09-05 DIAGNOSIS — I5020 Unspecified systolic (congestive) heart failure: Secondary | ICD-10-CM | POA: Diagnosis not present

## 2016-09-05 DIAGNOSIS — I4891 Unspecified atrial fibrillation: Secondary | ICD-10-CM | POA: Diagnosis not present

## 2016-09-05 DIAGNOSIS — I509 Heart failure, unspecified: Secondary | ICD-10-CM | POA: Diagnosis not present

## 2016-09-27 DIAGNOSIS — I4891 Unspecified atrial fibrillation: Secondary | ICD-10-CM | POA: Diagnosis not present

## 2016-09-27 DIAGNOSIS — I509 Heart failure, unspecified: Secondary | ICD-10-CM | POA: Diagnosis not present

## 2016-10-11 DIAGNOSIS — I4891 Unspecified atrial fibrillation: Secondary | ICD-10-CM | POA: Diagnosis not present

## 2016-10-11 DIAGNOSIS — E119 Type 2 diabetes mellitus without complications: Secondary | ICD-10-CM | POA: Diagnosis not present

## 2016-10-11 DIAGNOSIS — I509 Heart failure, unspecified: Secondary | ICD-10-CM | POA: Diagnosis not present

## 2016-10-11 DIAGNOSIS — L89309 Pressure ulcer of unspecified buttock, unspecified stage: Secondary | ICD-10-CM | POA: Diagnosis not present

## 2016-10-26 DIAGNOSIS — I509 Heart failure, unspecified: Secondary | ICD-10-CM | POA: Diagnosis not present

## 2016-10-26 DIAGNOSIS — I4891 Unspecified atrial fibrillation: Secondary | ICD-10-CM | POA: Diagnosis not present

## 2016-10-26 DIAGNOSIS — E119 Type 2 diabetes mellitus without complications: Secondary | ICD-10-CM | POA: Diagnosis not present

## 2016-11-02 DIAGNOSIS — R2231 Localized swelling, mass and lump, right upper limb: Secondary | ICD-10-CM | POA: Diagnosis not present

## 2016-11-04 DIAGNOSIS — D481 Neoplasm of uncertain behavior of connective and other soft tissue: Secondary | ICD-10-CM | POA: Diagnosis not present

## 2016-11-04 DIAGNOSIS — C44622 Squamous cell carcinoma of skin of right upper limb, including shoulder: Secondary | ICD-10-CM | POA: Diagnosis not present

## 2016-11-15 DIAGNOSIS — L89309 Pressure ulcer of unspecified buttock, unspecified stage: Secondary | ICD-10-CM | POA: Diagnosis not present

## 2016-11-15 DIAGNOSIS — I50810 Right heart failure, unspecified: Secondary | ICD-10-CM | POA: Diagnosis not present

## 2016-11-17 DIAGNOSIS — R2231 Localized swelling, mass and lump, right upper limb: Secondary | ICD-10-CM | POA: Diagnosis not present

## 2016-11-17 DIAGNOSIS — D481 Neoplasm of uncertain behavior of connective and other soft tissue: Secondary | ICD-10-CM | POA: Diagnosis not present

## 2016-11-29 DIAGNOSIS — I509 Heart failure, unspecified: Secondary | ICD-10-CM | POA: Diagnosis not present

## 2016-11-29 DIAGNOSIS — I4891 Unspecified atrial fibrillation: Secondary | ICD-10-CM | POA: Diagnosis not present

## 2017-01-10 DIAGNOSIS — H02831 Dermatochalasis of right upper eyelid: Secondary | ICD-10-CM | POA: Diagnosis not present

## 2017-01-10 DIAGNOSIS — H353132 Nonexudative age-related macular degeneration, bilateral, intermediate dry stage: Secondary | ICD-10-CM | POA: Diagnosis not present

## 2017-01-10 DIAGNOSIS — Z961 Presence of intraocular lens: Secondary | ICD-10-CM | POA: Diagnosis not present

## 2017-01-10 DIAGNOSIS — H02889 Meibomian gland dysfunction of unspecified eye, unspecified eyelid: Secondary | ICD-10-CM | POA: Diagnosis not present

## 2017-02-07 DIAGNOSIS — S81802A Unspecified open wound, left lower leg, initial encounter: Secondary | ICD-10-CM | POA: Diagnosis not present

## 2017-02-07 DIAGNOSIS — L723 Sebaceous cyst: Secondary | ICD-10-CM | POA: Diagnosis not present

## 2017-02-14 DIAGNOSIS — D649 Anemia, unspecified: Secondary | ICD-10-CM | POA: Diagnosis not present

## 2017-02-14 DIAGNOSIS — I10 Essential (primary) hypertension: Secondary | ICD-10-CM | POA: Diagnosis not present

## 2017-02-14 DIAGNOSIS — E119 Type 2 diabetes mellitus without complications: Secondary | ICD-10-CM | POA: Diagnosis not present

## 2017-02-14 DIAGNOSIS — I4891 Unspecified atrial fibrillation: Secondary | ICD-10-CM | POA: Diagnosis not present

## 2017-02-14 DIAGNOSIS — D509 Iron deficiency anemia, unspecified: Secondary | ICD-10-CM | POA: Diagnosis not present

## 2017-02-14 DIAGNOSIS — Z6841 Body Mass Index (BMI) 40.0 and over, adult: Secondary | ICD-10-CM | POA: Diagnosis not present

## 2017-02-14 DIAGNOSIS — E118 Type 2 diabetes mellitus with unspecified complications: Secondary | ICD-10-CM | POA: Diagnosis not present

## 2017-03-20 DIAGNOSIS — I82401 Acute embolism and thrombosis of unspecified deep veins of right lower extremity: Secondary | ICD-10-CM | POA: Diagnosis not present

## 2017-03-20 DIAGNOSIS — I10 Essential (primary) hypertension: Secondary | ICD-10-CM | POA: Diagnosis not present

## 2017-03-20 DIAGNOSIS — I4891 Unspecified atrial fibrillation: Secondary | ICD-10-CM | POA: Diagnosis not present

## 2017-06-19 DIAGNOSIS — E119 Type 2 diabetes mellitus without complications: Secondary | ICD-10-CM | POA: Diagnosis not present

## 2017-06-19 DIAGNOSIS — I4891 Unspecified atrial fibrillation: Secondary | ICD-10-CM | POA: Diagnosis not present

## 2017-06-19 DIAGNOSIS — I82401 Acute embolism and thrombosis of unspecified deep veins of right lower extremity: Secondary | ICD-10-CM | POA: Diagnosis not present

## 2017-09-20 DIAGNOSIS — I509 Heart failure, unspecified: Secondary | ICD-10-CM | POA: Diagnosis not present

## 2017-09-20 DIAGNOSIS — E139 Other specified diabetes mellitus without complications: Secondary | ICD-10-CM | POA: Diagnosis not present

## 2017-09-20 DIAGNOSIS — I4891 Unspecified atrial fibrillation: Secondary | ICD-10-CM | POA: Diagnosis not present

## 2017-10-16 ENCOUNTER — Emergency Department (HOSPITAL_COMMUNITY): Payer: PPO

## 2017-10-16 ENCOUNTER — Observation Stay (HOSPITAL_COMMUNITY)
Admission: EM | Admit: 2017-10-16 | Discharge: 2017-10-18 | Disposition: A | Discharge status: Skilled Nursing Facility | Payer: PPO | Attending: Internal Medicine | Admitting: Internal Medicine

## 2017-10-16 ENCOUNTER — Other Ambulatory Visit: Payer: Self-pay

## 2017-10-16 ENCOUNTER — Encounter (HOSPITAL_COMMUNITY): Payer: Self-pay | Admitting: Emergency Medicine

## 2017-10-16 DIAGNOSIS — I252 Old myocardial infarction: Secondary | ICD-10-CM | POA: Insufficient documentation

## 2017-10-16 DIAGNOSIS — I110 Hypertensive heart disease with heart failure: Secondary | ICD-10-CM | POA: Diagnosis not present

## 2017-10-16 DIAGNOSIS — S72002A Fracture of unspecified part of neck of left femur, initial encounter for closed fracture: Secondary | ICD-10-CM | POA: Diagnosis not present

## 2017-10-16 DIAGNOSIS — Z7982 Long term (current) use of aspirin: Secondary | ICD-10-CM | POA: Diagnosis not present

## 2017-10-16 DIAGNOSIS — Z79899 Other long term (current) drug therapy: Secondary | ICD-10-CM | POA: Diagnosis not present

## 2017-10-16 DIAGNOSIS — Z87442 Personal history of urinary calculi: Secondary | ICD-10-CM | POA: Insufficient documentation

## 2017-10-16 DIAGNOSIS — S72422A Displaced fracture of lateral condyle of left femur, initial encounter for closed fracture: Secondary | ICD-10-CM | POA: Diagnosis not present

## 2017-10-16 DIAGNOSIS — I4891 Unspecified atrial fibrillation: Secondary | ICD-10-CM | POA: Diagnosis present

## 2017-10-16 DIAGNOSIS — R2681 Unsteadiness on feet: Secondary | ICD-10-CM | POA: Diagnosis not present

## 2017-10-16 DIAGNOSIS — I482 Chronic atrial fibrillation: Secondary | ICD-10-CM | POA: Insufficient documentation

## 2017-10-16 DIAGNOSIS — W19XXXA Unspecified fall, initial encounter: Secondary | ICD-10-CM | POA: Diagnosis not present

## 2017-10-16 DIAGNOSIS — I10 Essential (primary) hypertension: Secondary | ICD-10-CM | POA: Diagnosis not present

## 2017-10-16 DIAGNOSIS — S72355A Nondisplaced comminuted fracture of shaft of left femur, initial encounter for closed fracture: Secondary | ICD-10-CM | POA: Diagnosis not present

## 2017-10-16 DIAGNOSIS — M25552 Pain in left hip: Secondary | ICD-10-CM | POA: Diagnosis not present

## 2017-10-16 DIAGNOSIS — R001 Bradycardia, unspecified: Secondary | ICD-10-CM | POA: Insufficient documentation

## 2017-10-16 DIAGNOSIS — I5032 Chronic diastolic (congestive) heart failure: Secondary | ICD-10-CM | POA: Insufficient documentation

## 2017-10-16 DIAGNOSIS — M9702XA Periprosthetic fracture around internal prosthetic left hip joint, initial encounter: Principal | ICD-10-CM

## 2017-10-16 DIAGNOSIS — N400 Enlarged prostate without lower urinary tract symptoms: Secondary | ICD-10-CM | POA: Diagnosis not present

## 2017-10-16 LAB — BASIC METABOLIC PANEL
Anion gap: 11 (ref 5–15)
BUN: 26 mg/dL — ABNORMAL HIGH (ref 8–23)
CHLORIDE: 104 mmol/L (ref 98–111)
CO2: 25 mmol/L (ref 22–32)
CREATININE: 1.03 mg/dL (ref 0.61–1.24)
Calcium: 8.7 mg/dL — ABNORMAL LOW (ref 8.9–10.3)
GFR calc non Af Amer: 60 mL/min — ABNORMAL LOW (ref >60)
Glucose, Bld: 172 mg/dL — ABNORMAL HIGH (ref 70–99)
Potassium: 3.8 mmol/L (ref 3.5–5.1)
SODIUM: 140 mmol/L (ref 135–145)

## 2017-10-16 LAB — CBC WITH DIFFERENTIAL/PLATELET
ABS IMMATURE GRANULOCYTES: 0.1 10*3/uL (ref 0.0–0.1)
BASOS PCT: 1 %
Basophils Absolute: 0 10*3/uL (ref 0.0–0.1)
Eosinophils Absolute: 0.1 10*3/uL (ref 0.0–0.7)
Eosinophils Relative: 1 %
HEMATOCRIT: 39.2 % (ref 39.0–52.0)
HEMOGLOBIN: 12.9 g/dL — AB (ref 13.0–17.0)
IMMATURE GRANULOCYTES: 1 %
LYMPHS ABS: 0.8 10*3/uL (ref 0.7–4.0)
Lymphocytes Relative: 12 %
MCH: 33 pg (ref 26.0–34.0)
MCHC: 32.9 g/dL (ref 30.0–36.0)
MCV: 100.3 fL — ABNORMAL HIGH (ref 78.0–100.0)
Monocytes Absolute: 0.5 10*3/uL (ref 0.1–1.0)
Monocytes Relative: 8 %
NEUTROS PCT: 77 %
Neutro Abs: 5.1 10*3/uL (ref 1.7–7.7)
Platelets: 124 10*3/uL — ABNORMAL LOW (ref 150–400)
RBC: 3.91 MIL/uL — AB (ref 4.22–5.81)
RDW: 13.9 % (ref 11.5–15.5)
WBC: 6.6 10*3/uL (ref 4.0–10.5)

## 2017-10-16 MED ORDER — FINASTERIDE 5 MG PO TABS
5.0000 mg | ORAL_TABLET | Freq: Every day | ORAL | Status: DC
  Administered 2017-10-16 – 2017-10-18 (×3): 5 mg via ORAL
  Filled 2017-10-16 (×3): qty 1

## 2017-10-16 MED ORDER — HEPARIN SODIUM (PORCINE) 5000 UNIT/ML IJ SOLN
5000.0000 [IU] | Freq: Three times a day (TID) | INTRAMUSCULAR | Status: DC
  Administered 2017-10-16 – 2017-10-18 (×7): 5000 [IU] via SUBCUTANEOUS
  Filled 2017-10-16 (×7): qty 1

## 2017-10-16 MED ORDER — BISACODYL 5 MG PO TBEC: 5.0000 mg | DELAYED_RELEASE_TABLET | Freq: Every day | ORAL | Status: DC | PRN

## 2017-10-16 MED ORDER — MORPHINE SULFATE (PF) 4 MG/ML IV SOLN: 0.5000 mg | INTRAVENOUS | Status: DC | PRN

## 2017-10-16 MED ORDER — FLEET ENEMA 7-19 GM/118ML RE ENEM: 1.0000 | ENEMA | Freq: Once | RECTAL | Status: DC | PRN

## 2017-10-16 MED ORDER — FUROSEMIDE 40 MG PO TABS
60.0000 mg | ORAL_TABLET | Freq: Every day | ORAL | Status: DC
  Administered 2017-10-16 – 2017-10-17 (×2): 60 mg via ORAL
  Filled 2017-10-16 (×2): qty 1

## 2017-10-16 MED ORDER — FUROSEMIDE 40 MG PO TABS
100.0000 mg | ORAL_TABLET | Freq: Every day | ORAL | Status: DC
  Administered 2017-10-17 – 2017-10-18 (×2): 100 mg via ORAL
  Filled 2017-10-16 (×2): qty 2

## 2017-10-16 MED ORDER — LOSARTAN POTASSIUM 50 MG PO TABS
50.0000 mg | ORAL_TABLET | Freq: Every day | ORAL | Status: DC
  Administered 2017-10-16 – 2017-10-18 (×3): 50 mg via ORAL
  Filled 2017-10-16 (×3): qty 1

## 2017-10-16 MED ORDER — FENTANYL CITRATE (PF) 100 MCG/2ML IJ SOLN
25.0000 ug | Freq: Once | INTRAMUSCULAR | Status: AC
  Administered 2017-10-16: 25 ug via INTRAVENOUS
  Filled 2017-10-16: qty 2

## 2017-10-16 MED ORDER — FUROSEMIDE 20 MG PO TABS: 60.0000 mg | ORAL_TABLET | Freq: Two times a day (BID) | ORAL | Status: DC

## 2017-10-16 MED ORDER — ACETAMINOPHEN 325 MG PO TABS
650.0000 mg | ORAL_TABLET | Freq: Once | ORAL | Status: AC
  Administered 2017-10-16: 650 mg via ORAL
  Filled 2017-10-16: qty 2

## 2017-10-16 MED ORDER — HYDROCODONE-ACETAMINOPHEN 5-325 MG PO TABS
1.0000 | ORAL_TABLET | Freq: Four times a day (QID) | ORAL | Status: DC | PRN
  Administered 2017-10-17: 1 via ORAL
  Filled 2017-10-16: qty 1

## 2017-10-16 MED ORDER — SENNOSIDES-DOCUSATE SODIUM 8.6-50 MG PO TABS: 1.0000 | ORAL_TABLET | Freq: Every evening | ORAL | Status: DC | PRN

## 2017-10-16 NOTE — H&P (Signed)
History and Physical    Duane Spears TKZ:[id number] DOB: 01-17-1921 DOA: 10/16/2017   PCP: Levin Erp, MD   Patient coming from:  Home    Chief Complaint: mechanical fall   HPI: Duane Spears is a 84 y.o. male atrial fibrillation, chronic bradycardia, congestive heart failure, BPH, who was brought to the emergency department after sustaining a mechanical fall.  The patient had a recent left hip replacement, and prior to admission, he was very mobile.  He lives at home alone.  He was cleaning the floors, on a wet surface, falling backwards.  He denies hitting his head.  He denies any neck pain.  No vision changes.  The patient did not sustain any syncopal episode, or presyncope.  He denies any dizziness or vertigo.  Immediately, he felt pain on the left hip, but he could still move it despite the pain.  He denies any recent illnesses.  He denies any bleeding issues.  He denies any nausea, vomiting, diarrhea, abdominal pain, he denies any dysuria or gross hematuria.  He denies any worsening lower extremity swelling or calf pain.  He does have intermittent cramps.  He is hard of hearing, but no confusion is reported.  He is able to move the rest of his extremities without difficulty.   ED Course:  BP (!) 136/100   Pulse (!) 52   Temp 98.3 F (36.8 C)   Resp (!) 23   Ht 6\' 2"  (1.88 m)   Wt 101.2 kg (223 lb)   SpO2 99%   BMI 28.63 kg/m   Orthopedic consultation was obtained.  They confirm a left periprosthetic hip fracture, but the plan is nonoperative management, with touchdown weightbearing to the left lower extremity.  The patient received pain medications with good control of the symptoms. Platelets 124 White count 6.6, hemoglobin 12.9. Left hip x-ray shows Minimally displaced fracture involving the posterolateral aspect of the proximal diaphysis of the femur with extension to the femoral component of the left total hip replacement EKG shows atrial fibrillation, with bradycardia, which  is chronic, rate 59. Review of Systems:  As per HPI otherwise all other systems reviewed and are negative  Past Medical History:  Diagnosis Date  . A-fib ([place])   . Bradycardia   . CHF (congestive heart failure) ([place])   . Prostate disorder     Past Surgical History:  Procedure Laterality Date  . APPENDECTOMY    . COLONOSCOPY    . HIP SURGERY      Social History Social History   Socioeconomic History  . Marital status: Widowed    Spouse name: Not on file  . Number of children: Not on file  . Years of education: Not on file  . Highest education level: Not on file  Occupational History  . Not on file  Social Needs  . Financial resource strain: Not on file  . Food insecurity:    Worry: Not on file    Inability: Not on file  . Transportation needs:    Medical: Not on file    Non-medical: Not on file  Tobacco Use  . Smoking status: Never Smoker  Substance and Sexual Activity  . Alcohol use: No  . Drug use: Not on file  . Sexual activity: Not on file  Lifestyle  . Physical activity:    Days per week: Not on file    Minutes per session: Not on file  . Stress: Not on file  Relationships  . Social connections:  Talks on phone: Not on file    Gets together: Not on file    Attends religious service: Not on file    Active member of club or organization: Not on file    Attends meetings of clubs or organizations: Not on file    Relationship status: Not on file  . Intimate partner violence:    Fear of current or ex partner: Not on file    Emotionally abused: Not on file    Physically abused: Not on file    Forced sexual activity: Not on file  Other Topics Concern  . Not on file  Social History Narrative  . Not on file     No Known Allergies  No family history on file.     Prior to Admission medications   Medication Sig Start Date End Date Taking? Authorizing Provider  aspirin 81 MG tablet Take 81 mg by mouth daily.    [provider]  finasteride  (PROSCAR) 5 MG tablet Take 5 mg by mouth daily.    [provider]  furosemide (LASIX) 20 MG tablet Take 20-60 mg by mouth 2 (two) times daily. 60mg  in AM and 20mg  in PM    [provider]  Multiple Vitamin (MULTIVITAMIN WITH MINERALS) TABS tablet Take 1 tablet by mouth daily.    [provider]  Multiple Vitamins-Minerals (PRESERVISION AREDS 2) CAPS Take 1 capsule by mouth 2 (two) times daily.    [provider]  THIAMINE HCL PO Take 1 tablet by mouth daily.    [provider]     Physical Exam:  Vitals:   10/16/17 0945 10/16/17 1027 10/16/17 1028 10/16/17 1030  BP:    (!) 136/100  Pulse: (!) 57 61 61 (!) 52  Resp: (!) 24 20 (!) 24 (!) 23  Temp:      SpO2: 96% 98% 98% 99%  Weight:      Height:       Constitutional: NAD, calm, uncomfortable due to left hip pain  Eyes: PERRL, lids and conjunctivae normal ENMT: Mucous membranes are moist, without exudate or lesions  Neck: normal, supple, no masses, no thyromegaly Respiratory: clear to auscultation bilaterally, no wheezing, no crackles. Normal respiratory effort  Cardiovascular: IRR soft 1/6  murmur, rubs or gallops.  1+ bilateral lower extremity edema. 2+ pedal pulses. No carotid bruits.  Abdomen: Soft, non tender, No hepatosplenomegaly. Bowel sounds positive.  Musculoskeletal: no clubbing / cyanosis.  Mild TTP left hip, painful to move. No significant left lower extremity deformity seen  Skin: no jaundice, No lesions. Chronic venous stasis changes in both lower extremities  Neurologic: Sensation intact  Strength equal in all extremities Psychiatric:   Alert and oriented x 3. Normal mood.     Labs on Admission: I have personally reviewed following labs and imaging studies  CBC: Recent Labs  Lab 10/16/17 0857  WBC 6.6  NEUTROABS 5.1  HGB 12.9*  HCT 39.2  MCV 100.3*  PLT 124*    Basic Metabolic Panel: Recent Labs  Lab 10/16/17 0857  NA 140  K 3.8  CL 104  CO2 25  GLUCOSE  172*  BUN 26*  CREATININE 1.03  CALCIUM 8.7*    GFR: Estimated Creatinine Clearance: 55.7 mL/min (by C-G formula based on SCr of 1.03 mg/dL).  Liver Function Tests: No results for input(s): AST, ALT, ALKPHOS, BILITOT, PROT, ALBUMIN in the last 168 hours. No results for input(s): LIPASE, AMYLASE in the last 168 hours. No results for input(s):  AMMONIA in the last 168 hours.  Coagulation Profile: No results for input(s): INR, PROTIME in the last 168 hours.  Cardiac Enzymes: No results for input(s): CKTOTAL, CKMB, CKMBINDEX, TROPONINI in the last 168 hours.  BNP (last 3 results) No results for input(s): PROBNP in the last 8760 hours.  HbA1C: No results for input(s): HGBA1C in the last 72 hours.  CBG: No results for input(s): GLUCAP in the last 168 hours.  Lipid Profile: No results for input(s): CHOL, HDL, LDLCALC, TRIG, CHOLHDL, LDLDIRECT in the last 72 hours.  Thyroid Function Tests: No results for input(s): TSH, T4TOTAL, FREET4, T3FREE, THYROIDAB in the last 72 hours.  Anemia Panel: No results for input(s): VITAMINB12, FOLATE, FERRITIN, TIBC, IRON, RETICCTPCT in the last 72 hours.  Urine analysis:    Component Value Date/Time   COLORURINE YELLOW 04/05/2015 2031   APPEARANCEUR CLEAR 04/05/2015 2031   LABSPEC 1.017 04/05/2015 2031   PHURINE 5.0 04/05/2015 2031   GLUCOSEU NEGATIVE 04/05/2015 2031   HGBUR NEGATIVE 04/05/2015 2031   BILIRUBINUR NEGATIVE 04/05/2015 2031   KETONESUR NEGATIVE 04/05/2015 2031   PROTEINUR NEGATIVE 04/05/2015 2031   UROBILINOGEN 0.2 10/11/2013 1010   NITRITE NEGATIVE 04/05/2015 2031   LEUKOCYTESUR NEGATIVE 04/05/2015 2031    Sepsis Labs: @LABRCNTIP (procalcitonin:4,lacticidven:4) )No results found for this or any previous visit (from the past 240 hour(s)).   Radiological Exams on Admission: Dg Hip Unilat W Or Wo Pelvis 2-3 Views Left  Result Date: 10/16/2017 CLINICAL DATA:  Post fall this morning, now with left thigh and hip pain. EXAM:  DG HIP (WITH OR WITHOUT PELVIS) 2-3V LEFT COMPARISON:  None. FINDINGS: There is a minimally displaced fracture involving the posterolateral aspect of the proximal diaphysis of femur with extension to the femoral component of the left total hip replacement. No definite evidence of hardware failure or loosening. Adjacent vascular calcifications. Limited visualization the pelvis and contralateral right hip demonstrates sequela prior right total hip replacement, incompletely evaluated. Degenerative change the lower lumbar spine is suspected though incompletely evaluated. IMPRESSION: Minimally displaced fracture involving the posterolateral aspect of the proximal diaphysis of the femur with extension to the femoral component of the left total hip replacement. Electronically Signed   By: Sandi Mariscal M.D.   On: 10/16/2017 08:28    EKG: Independently reviewed.  Assessment/Plan Principal Problem:   Left displaced femoral neck fracture (HCC) Active Problems:   Atrial fibrillation (HCC)   Benign prostatic hyperplasia      Left Minimally displaced fracture  of the femur with extension to the femoral component of the left total hip replacement, no plans for OR Orthopedic consultation was obtained.  They confirm a left periprosthetic hip fracture, but the plan is nonoperative management, with touchdown weightbearing to the left lower extremity.  The patient received pain medications with good control of the symptoms. Admit to med surg  Hip fracture order set Pain control with IV morphine and oral meds  PT/OT  Care Management for   Rehab Placement   Touch down weight bearing   Atrial Fibrillation Not on anticoagulation   Rate controlled Continue to monitor   Hypertension BP  162/84   Pulse 58  Continue home Cozaar   Benign prostatic hypertrophy, no acute issues Continue Proscar and Lasix   Thrombocytopenia, chronic, currently at 124k. No transfusion is indicated at this time Monitor counts  closely Transfuse 1 unit of platelets if count is less or equal than 10,000 or 20,000 if the patient is acutely bleeding Hold Heparin  if  platelets drop to less than 50,000     DVT prophylaxis:  Heparin sq  Code Status:    DNR  Family Communication:  Discussed with patient Disposition Plan: Expect patient to be discharged to home after condition improves Consults called:    Ortho per EDP Admission status: [place]   Sharene Butters, PA-C [facility]   Amion text  [phone]   10/16/2017, 11:04 AM

## 2017-10-16 NOTE — Consult Note (Signed)
Reason for Consult:Left hip fx Referring Physician: [name] is an 84 y.o. male.  HPI: Duane Spears was sweeping at home and fell backwards onto his behind this morning. He had immediate left hip pain and could not get up. He was brought to the ED where x-rays showed a left periprosthetic hip fx and orthopedic surgery was consulted.   Past Medical History:  Diagnosis Date  . A-fib ([place])   . Bradycardia   . CHF (congestive heart failure) ([place])   . Prostate disorder     Past Surgical History:  Procedure Laterality Date  . APPENDECTOMY    . COLONOSCOPY    . HIP SURGERY      No family history on file.  Social History:  reports that he has never smoked. He does not have any smokeless tobacco history on file. He reports that he does not drink alcohol. His drug history is not on file.  Allergies: No Known Allergies  Medications: I have reviewed the patient's current medications.  No results found for this or any previous visit (from the past 48 hour(s)).  Dg Hip Unilat W Or Wo Pelvis 2-3 Views Left  Result Date: 10/16/2017 CLINICAL DATA:  Post fall this morning, now with left thigh and hip pain. EXAM: DG HIP (WITH OR WITHOUT PELVIS) 2-3V LEFT COMPARISON:  None. FINDINGS: There is a minimally displaced fracture involving the posterolateral aspect of the proximal diaphysis of femur with extension to the femoral component of the left total hip replacement. No definite evidence of hardware failure or loosening. Adjacent vascular calcifications. Limited visualization the pelvis and contralateral right hip demonstrates sequela prior right total hip replacement, incompletely evaluated. Degenerative change the lower lumbar spine is suspected though incompletely evaluated. IMPRESSION: Minimally displaced fracture involving the posterolateral aspect of the proximal diaphysis of the femur with extension to the femoral component of the left total hip replacement. Electronically Signed   By:  Sandi Mariscal M.D.   On: 10/16/2017 08:28    Review of Systems  Constitutional: Negative for weight loss.  HENT: Negative for ear discharge, ear pain, hearing loss and tinnitus.   Eyes: Negative for blurred vision, double vision, photophobia and pain.  Respiratory: Negative for cough, sputum production and shortness of breath.   Cardiovascular: Negative for chest pain.  Gastrointestinal: Negative for abdominal pain, nausea and vomiting.  Genitourinary: Negative for dysuria, flank pain, frequency and urgency.  Musculoskeletal: Positive for joint pain (Left hip). Negative for back pain, falls, myalgias and neck pain.  Neurological: Negative for dizziness, tingling, sensory change, focal weakness, loss of consciousness and headaches.  Endo/Heme/Allergies: Does not bruise/bleed easily.  Psychiatric/Behavioral: Negative for depression, memory loss and substance abuse. The patient is not nervous/anxious.    Blood pressure (!) 157/70, pulse 64, temperature 98.3 F (36.8 C), resp. rate 15, height 6\' 2"  (1.88 m), weight 101.2 kg (223 lb), SpO2 97 %. Physical Exam  Constitutional: He appears well-developed and well-nourished. No distress.  HENT:  Head: Normocephalic and atraumatic.  Eyes: Conjunctivae are normal. Right eye exhibits no discharge. Left eye exhibits no discharge. No scleral icterus.  Neck: Normal range of motion.  Cardiovascular: Normal rate and regular rhythm.  Respiratory: Effort normal. No respiratory distress.  Musculoskeletal:  LLE No traumatic wounds, ecchymosis, or rash  Mild TTP hip  No knee or ankle effusion  Knee stable to varus/ valgus and anterior/posterior stress  Sens DPN, SPN, TN intact  Motor EHL, ext, flex, evers 5/5  DP 2+, PT  0, 2+ NP edema  Neurological: He is alert.  Skin: Skin is warm and dry. He is not diaphoretic.  Psychiatric: He has a normal mood and affect. His behavior is normal.    Assessment/Plan: Left periprosthetic hip fx -- Will plan  non-operative management with TDWB to LLE.  CHF Afib    Lisette Abu, PA-C Orthopedic Surgery [phone] 10/16/2017, 9:20 AM

## 2017-10-16 NOTE — ED Provider Notes (Signed)
[place] [facility] EMERGENCY DEPARTMENT Provider Note   CSN: [id number] Arrival date & time: 10/16/17  0740     History   Chief Complaint Chief Complaint  Patient presents with  . Fall    HPI Duane Spears is a 84 y.o. male.  The history is provided by the patient, the EMS personnel and a relative. No language interpreter was used.  Fall    Duane Spears is a 84 y.o. male who presents to the Emergency Department complaining of fall. He presents to the emergency department for evaluation of injuries following a fall. He was sweeping his floor this morning when he fell backwards, hurting his left hip. He denies any head injury or loss of consciousness. He lives at home alone. He has no pain unless he attempts to move the left leg. He denies any recent illnesses. He does have a history of bilateral hip replacements as well as atrial fibrillation. Past Medical History:  Diagnosis Date  . A-fib ([place])   . Bradycardia   . CHF (congestive heart failure) ([place])   . Prostate disorder     Patient Active Problem List   Diagnosis Date Noted  . Left displaced femoral neck fracture ([place]) 10/16/2017  . UTI (lower urinary tract infection) 10/11/2013  . Atrial fibrillation ([place]) 10/11/2013  . Leukocytosis 10/11/2013  . Benign prostatic hyperplasia 10/11/2013  . Pseudomonas infection 10/11/2013    Past Surgical History:  Procedure Laterality Date  . APPENDECTOMY    . COLONOSCOPY    . HIP SURGERY          Home Medications    Prior to Admission medications   Medication Sig Start Date End Date Taking? Authorizing Provider  finasteride (PROSCAR) 5 MG tablet Take 5 mg by mouth daily.   Yes [provider]  furosemide (LASIX) 20 MG tablet Take 60-100 mg by mouth 2 (two) times daily. 100mg  (5 tablets) in the AM and 60mg  (3 tablets) in the evening   Yes [provider]  losartan (COZAAR) 50 MG tablet Take 1 tablet by mouth daily. 09/25/17  Yes [provider]  Multiple Vitamin (MULTIVITAMIN WITH MINERALS) TABS tablet Take 1 tablet by mouth daily.   Yes [provider]  Multiple Vitamins-Minerals (PRESERVISION AREDS 2) CAPS Take 1 capsule by mouth 2 (two) times daily.   Yes [provider]  THIAMINE HCL PO Take 1 tablet by mouth daily.   Yes [provider]    Family History No family history on file.  Social History Social History   Tobacco Use  . Smoking status: Never Smoker  Substance Use Topics  . Alcohol use: No  . Drug use: Not on file     Allergies   Patient has no known allergies.   Review of Systems Review of Systems  All other systems reviewed and are negative.    Physical Exam Updated Vital Signs BP 140/74 (BP Location: Right Arm)   Pulse 60   Temp 98.4 F (36.9 C) (Oral)   Resp 16   Ht 6\' 2"  (1.88 m)   Wt 101.2 kg (223 lb)   SpO2 99%   BMI 28.63 kg/m   Physical Exam  Constitutional: He is oriented to person, place, and time. He appears well-developed and well-nourished.  HENT:  Head: Normocephalic and atraumatic.  Cardiovascular: Normal rate.  No murmur heard. Irregular rhythm  Pulmonary/Chest: Effort normal and breath sounds normal. No respiratory distress.  Abdominal: Soft. There is no tenderness. There  is no rebound and no guarding.  Musculoskeletal: He exhibits no tenderness.  2+ DP pulses bilaterally. Trace pitting edema to bilateral lower extremities. There is no significant tenderness throughout bilateral lower extremities but patient is unable to arrange the left hip. His left lower extremity is externally rotated and shortened. Skin tear to left elbow, right 1st mcp without local tenderness, painless ROM.    Neurological: He is alert and oriented to person, place, and time.  Skin: Skin is warm and dry.  Psychiatric: He has a normal mood and affect. His behavior is normal.  Nursing note and vitals reviewed.    ED Treatments / Results  Labs (all labs  ordered are listed, but only abnormal results are displayed) Labs Reviewed  BASIC METABOLIC PANEL - Abnormal; Notable for the following components:      Result Value   Glucose, Bld 172 (*)    BUN 26 (*)    Calcium 8.7 (*)    GFR calc non Af Amer 60 (*)    All other components within normal limits  CBC WITH DIFFERENTIAL/PLATELET - Abnormal; Notable for the following components:   RBC 3.91 (*)    Hemoglobin 12.9 (*)    MCV 100.3 (*)    Platelets 124 (*)    All other components within normal limits  CBC  BASIC METABOLIC PANEL    EKG EKG Interpretation  Date/Time:  Monday October 16 2017 09:27:08 EDT Ventricular Rate:  55 PR Interval:    QRS Duration: 102 QT Interval:  478 QTC Calculation: 458 R Axis:   52 Text Interpretation:  Atrial fibrillation Low voltage, extremity leads Minimal ST depression, inferior leads Confirmed by Nat Christen [phone]) on 10/16/2017 9:32:59 AM   Radiology Dg Hip Unilat W Or Wo Pelvis 2-3 Views Left  Result Date: 10/16/2017 CLINICAL DATA:  Post fall this morning, now with left thigh and hip pain. EXAM: DG HIP (WITH OR WITHOUT PELVIS) 2-3V LEFT COMPARISON:  None. FINDINGS: There is a minimally displaced fracture involving the posterolateral aspect of the proximal diaphysis of femur with extension to the femoral component of the left total hip replacement. No definite evidence of hardware failure or loosening. Adjacent vascular calcifications. Limited visualization the pelvis and contralateral right hip demonstrates sequela prior right total hip replacement, incompletely evaluated. Degenerative change the lower lumbar spine is suspected though incompletely evaluated. IMPRESSION: Minimally displaced fracture involving the posterolateral aspect of the proximal diaphysis of the femur with extension to the femoral component of the left total hip replacement. Electronically Signed   By: Sandi Mariscal M.D.   On: 10/16/2017 08:28    Procedures Procedures (including  critical care time)  Medications Ordered in ED Medications  heparin injection 5,000 Units (5,000 Units Subcutaneous Given 10/16/17 1342)  HYDROcodone-acetaminophen (NORCO/VICODIN) 5-325 MG per tablet 1-2 tablet (has no administration in time range)  morphine 4 MG/ML injection 0.52 mg (has no administration in time range)  senna-docusate (Senokot-S) tablet 1 tablet (has no administration in time range)  bisacodyl (DULCOLAX) EC tablet 5 mg (has no administration in time range)  sodium phosphate (FLEET) 7-19 GM/118ML enema 1 enema (has no administration in time range)  finasteride (PROSCAR) tablet 5 mg (5 mg Oral Given 10/16/17 1221)  losartan (COZAAR) tablet 50 mg (50 mg Oral Given 10/16/17 1221)  furosemide (LASIX) tablet 100 mg (has no administration in time range)    And  furosemide (LASIX) tablet 60 mg (60 mg Oral Given 10/16/17 1707)  acetaminophen (TYLENOL) tablet 650 mg (650 mg  Oral Given 10/16/17 1043)  fentaNYL (SUBLIMAZE) injection 25 mcg (25 mcg Intravenous Given 10/16/17 1040)     Initial Impression / Assessment and Plan / ED Course  I have reviewed the triage vital signs and the nursing notes.  Pertinent labs & imaging results that were available during my care of the patient were reviewed by me and considered in my medical decision making (see chart for details).  Clinical Course as of Oct 17 1743  Mon Oct 16, 2017  0800 EKG 12-Lead [SP]    Clinical Course User Index [SP] Alfredia Client, Talitha Givens    Here for evaluation of injuries following a fall. He has pain and decreased range of motion to the left hip. Imaging does demonstrate a proximal femur fracture adjacent to his prosthesis. No evidence of reports of head injury. Discussed with Dr. Percell Miller, who recommends medicine admission with orthopedics consult. Hospitalist consulted for admission. Patient and family updated findings of studies and recommendation for admission and they are in agreement with treatment plan.  Final  Clinical Impressions(s) / ED Diagnoses   Final diagnoses:  Fall, initial encounter    ED Discharge Orders    None       Quintella Reichert, MD 10/16/17 1745

## 2017-10-16 NOTE — Evaluation (Signed)
Physical Therapy Evaluation Patient Details Name: Duane Spears MRN: [id number] DOB: [birth year] Today's Date: 10/16/2017   History of Present Illness  84yo male brought to the ED after falling at home while sweeping. Of note he did recently have his L hip replaced. Imaging is positive for minimally displaced femur fracture with extension to the femoral component of L THR. Per ortho, he is to have non-operative management. PMH A-fib, bradycardia, CHF, hip replacement   Clinical Impression   Patient received in bed, very pleasant but very HOH and willing to participate in PT today; family was present and assisted in providing PLOF details, reports he has been requiring significant increases in level of assistance at home. He requires heavy ModA for functional bed mobility, unable to safely perform sit to stand transfers and has great difficulty in maintaining TDWB status L LE with assist of just +1. Strongly recommend assist of +2 for mobility moving forward. He was left in bed with all needs met, alarm activated, and family present. He will continue to benefit from skilled PT services in the acute setting as well as ongoing rehabilitation in the ST-SNF setting moving forward.     Follow Up Recommendations SNF    Equipment Recommendations  Other (comment)(defer to next venue )    Recommendations for Other Services       Precautions / Restrictions Precautions Precautions: Fall Restrictions Weight Bearing Restrictions: Yes LLE Weight Bearing: Touchdown weight bearing      Mobility  Bed Mobility Overal bed mobility: Needs Assistance Bed Mobility: Supine to Sit;Sit to Supine;Rolling Rolling: Mod assist   Supine to sit: Mod assist Sit to supine: Mod assist   General bed mobility comments: heavy ModA for functional bed mobility, MaxAx2 to reposition in bed   Transfers Overall transfer level: Needs assistance Equipment used: Rolling walker (2 wheeled) Transfers: Sit to/from  Stand Sit to Stand: Max assist;From elevated surface         General transfer comment: unable to safety stand with TDWB L LE and assist of +1, will require +2 for progression of mobility   Ambulation/Gait             General Gait Details: unable   Stairs            Wheelchair Mobility    Modified Rankin (Stroke Patients Only)       Balance Overall balance assessment: Mild deficits observed, not formally tested;History of Falls                                           Pertinent Vitals/Pain Pain Assessment: Faces Faces Pain Scale: Hurts little more Pain Location: L LE in general  Pain Descriptors / Indicators: Aching;Discomfort;Sore Pain Intervention(s): Limited activity within patient's tolerance;Monitored during session;Repositioned    Home Living Family/patient expects to be discharged to:: Private residence Living Arrangements: Alone Available Help at Discharge: Family;Available PRN/intermittently Type of Home: House Home Access: Level entry     Home Layout: One level Home Equipment: Walker - 2 wheels;Bedside commode      Prior Function Level of Independence: Independent with assistive device(s)         Comments: but family states that he had been more dependent on them for standing up from chairs and transfers recently      Hand Dominance        Extremity/Trunk Assessment   Upper Extremity Assessment  Upper Extremity Assessment: Defer to OT evaluation    Lower Extremity Assessment Lower Extremity Assessment: Generalized weakness    Cervical / Trunk Assessment Cervical / Trunk Assessment: Kyphotic  Communication   Communication: HOH  Cognition Arousal/Alertness: Awake/alert Behavior During Therapy: WFL for tasks assessed/performed Overall Cognitive Status: Within Functional Limits for tasks assessed                                 General Comments: requires some repeated commands but this seems to  be due to [facility] rather than confusion as when he can hear, he follows cues well       General Comments      Exercises     Assessment/Plan    PT Assessment Patient needs continued PT services  PT Problem List Decreased strength;Decreased mobility;Decreased safety awareness;Decreased coordination;Decreased activity tolerance;Decreased balance;Decreased knowledge of use of DME;Pain       PT Treatment Interventions DME instruction;Therapeutic activities;Gait training;Therapeutic exercise;Patient/family education;Stair training;Balance training;Functional mobility training;Neuromuscular re-education;Manual techniques    PT Goals (Current goals can be found in the Care Plan section)  Acute Rehab PT Goals Patient Stated Goal: to get up and walk  PT Goal Formulation: With patient/family Time For Goal Achievement: 10/30/17 Potential to Achieve Goals: Fair    Frequency Min 2X/week   Barriers to discharge        Co-evaluation               AM-PAC PT "6 Clicks" Daily Activity  Outcome Measure Difficulty turning over in bed (including adjusting bedclothes, sheets and blankets)?: Unable Difficulty moving from lying on back to sitting on the side of the bed? : Unable Difficulty sitting down on and standing up from a chair with arms (e.g., wheelchair, bedside commode, etc,.)?: Unable Help needed moving to and from a bed to chair (including a wheelchair)?: Total Help needed walking in hospital room?: Total Help needed climbing 3-5 steps with a railing? : Total 6 Click Score: 6    End of Session Equipment Utilized During Treatment: Gait belt Activity Tolerance: Patient tolerated treatment well Patient left: in bed;with bed alarm set;with family/visitor present;with call bell/phone within reach Nurse Communication: Mobility status PT Visit Diagnosis: Muscle weakness (generalized) (M62.81);History of falling (Z91.81);Difficulty in walking, not elsewhere classified (R26.2)    Time:  1525-1600 PT Time Calculation (min) (ACUTE ONLY): 35 min   Charges:   PT Evaluation $PT Eval Low Complexity: 1 Low PT Treatments $Therapeutic Activity: 8-22 mins        Deniece Ree PT, DPT, CBIS  Supplemental Physical Therapist [place]   Pager [phone]

## 2017-10-16 NOTE — ED Notes (Signed)
Admitting Provider at bedside. 

## 2017-10-16 NOTE — ED Notes (Signed)
Incentive spirometer given and educated patient and family about use and benefits. Patient was able to demonstrate accurately 12 times, he achieved 1585ml.

## 2017-10-16 NOTE — ED Triage Notes (Addendum)
Patient arrived by ambulance from home alone. He reported that he was sweeping, "I did not have my hand on my walker and I fell." He reports pain in his leg. Hx of AFIB; No blood thinners.

## 2017-10-16 NOTE — Progress Notes (Signed)
OT Cancellation Note  Patient Details Name: ADRIC WREDE MRN: [id number] DOB: November 04, 1920   Cancelled Treatment:    Reason Eval/Treat Not Completed: Other (comment)(Just finished with PT and requesting to rest. Will return as schedule allows.)  [place], OTR/L Acute Rehab Pager: [phone] Office: [phone] 10/16/2017, 4:02 PM

## 2017-10-17 ENCOUNTER — Encounter (HOSPITAL_COMMUNITY): Payer: Self-pay

## 2017-10-17 DIAGNOSIS — M9702XA Periprosthetic fracture around internal prosthetic left hip joint, initial encounter: Principal | ICD-10-CM

## 2017-10-17 LAB — BASIC METABOLIC PANEL
Anion gap: 10 (ref 5–15)
BUN: 26 mg/dL — AB (ref 8–23)
CO2: 26 mmol/L (ref 22–32)
Calcium: 8.4 mg/dL — ABNORMAL LOW (ref 8.9–10.3)
Chloride: 105 mmol/L (ref 98–111)
Creatinine, Ser: 1.07 mg/dL (ref 0.61–1.24)
GFR calc Af Amer: >60 mL/min (ref >60)
GFR, EST NON AFRICAN AMERICAN: 57 mL/min — AB (ref >60)
GLUCOSE: 161 mg/dL — AB (ref 70–99)
POTASSIUM: 4 mmol/L (ref 3.5–5.1)
Sodium: 141 mmol/L (ref 135–145)

## 2017-10-17 LAB — CBC
HCT: 36.6 % — ABNORMAL LOW (ref 39.0–52.0)
Hemoglobin: 12.1 g/dL — ABNORMAL LOW (ref 13.0–17.0)
MCH: 32.6 pg (ref 26.0–34.0)
MCHC: 33.1 g/dL (ref 30.0–36.0)
MCV: 98.7 fL (ref 78.0–100.0)
PLATELETS: 124 10*3/uL — AB (ref 150–400)
RBC: 3.71 MIL/uL — AB (ref 4.22–5.81)
RDW: 13.9 % (ref 11.5–15.5)
WBC: 7.9 10*3/uL (ref 4.0–10.5)

## 2017-10-17 NOTE — NC FL2 (Signed)
[place] LEVEL OF CARE SCREENING TOOL     IDENTIFICATION  Patient Name: Duane Spears Birthdate: [birth year] Sex: male Admission Date (Current Location): 10/16/2017  [facility] and [place] Number:  Herbalist and Address:  [facility]. [facility], [place] [address] [place], [place] [address]      Provider Number: [id number]  Attending Physician Name and Address:  Nita Sells, MD  Relative Name and Phone Number:  [name] [phone]    Current Level of Care: Hospital Recommended Level of Care: [place] Prior Approval Number:    Date Approved/Denied:   PASRR Number: [id number]  Discharge Plan: SNF    Current Diagnoses: Patient Active Problem List   Diagnosis Date Noted  . Periprosthetic fracture around internal prosthetic left hip joint ([place]) 10/17/2017  . Left displaced femoral neck fracture ([place]) 10/16/2017  . UTI (lower urinary tract infection) 10/11/2013  . Atrial fibrillation ([place]) 10/11/2013  . Leukocytosis 10/11/2013  . Benign prostatic hyperplasia 10/11/2013  . Pseudomonas infection 10/11/2013    Orientation RESPIRATION BLADDER Height & Weight     Time, Self, Situation, Place  Normal Continent Weight: 223 lb (101.2 kg) Height:  6\' 2"  (188 cm)  BEHAVIORAL SYMPTOMS/MOOD NEUROLOGICAL BOWEL NUTRITION STATUS      Continent Diet(see discharge summary)  AMBULATORY STATUS COMMUNICATION OF NEEDS Skin     Verbally Normal                       Personal Care Assistance Level of Assistance  Bathing, Feeding, Dressing Bathing Assistance: Limited assistance Feeding assistance: Independent Dressing Assistance: Limited assistance     Functional Limitations Info  Sight, Hearing, Speech Sight Info: Adequate Hearing Info: Impaired(left ear) Speech Info: Adequate    SPECIAL CARE FACTORS FREQUENCY  PT (By licensed PT), OT (By licensed OT)     PT Frequency: 3x per week OT Frequency: 3x per week            Contractures Contractures Info: Not present    Additional Factors Info  Code Status, Allergies Code Status Info: DNR Allergies Info: NKA           Current Medications (10/17/2017):  This is the current hospital active medication list Current Facility-Administered Medications  Medication Dose Route Frequency Provider Last Rate Last Dose  . bisacodyl (DULCOLAX) EC tablet 5 mg  5 mg Oral Daily PRN Rondel Jumbo, PA-C      . finasteride (PROSCAR) tablet 5 mg  5 mg Oral Daily Rondel Jumbo, PA-C   5 mg at 10/17/17 4742  . furosemide (LASIX) tablet 100 mg  100 mg Oral Daily Skeet Simmer, RPH   100 mg at 10/17/17 0818   And  . furosemide (LASIX) tablet 60 mg  60 mg Oral Daily Skeet Simmer, RPH   60 mg at 10/16/17 1707  . heparin injection 5,000 Units  5,000 Units Subcutaneous Q8H Rondel Jumbo, PA-C   5,000 Units at 10/17/17 1447  . HYDROcodone-acetaminophen (NORCO/VICODIN) 5-325 MG per tablet 1-2 tablet  1-2 tablet Oral Q6H PRN Rondel Jumbo, PA-C   1 tablet at 10/17/17 1057  . losartan (COZAAR) tablet 50 mg  50 mg Oral Daily Rondel Jumbo, PA-C   50 mg at 10/17/17 0818  . morphine 4 MG/ML injection 0.52 mg  0.52 mg Intravenous Q2H PRN Rondel Jumbo, PA-C      . senna-docusate (Senokot-S) tablet 1 tablet  1 tablet Oral  QHS PRN Rondel Jumbo, PA-C      . sodium phosphate (FLEET) 7-19 GM/118ML enema 1 enema  1 enema Rectal Once PRN Rondel Jumbo, PA-C         Discharge Medications: Please see discharge summary for a list of discharge medications.  Relevant Imaging Results:  Relevant Lab Results:   Additional Information SSN [id number]  Vinie Sill, LCSWA

## 2017-10-17 NOTE — Evaluation (Signed)
Occupational Therapy Evaluation Patient Details Name: Duane Spears MRN: [id number] DOB: 21-Dec-1920 Today's Date: 10/17/2017    History of Present Illness 84 y/o male brought to the ED after falling at home while sweeping. Of note he did recently have his L hip replaced. Imaging is positive for minimally displaced femur fracture with extension to the femoral component of L THR. Per ortho, he is to have non-operative management. PMH A-fib, bradycardia, CHF, hip replacement    Clinical Impression   This 85 y/o male presents with the above. At baseline pt lives alone, was mod independent with ADLs and functional mobility using RW. Pt's family reports pt with history of falls at home prior to this admission. Pt currently presenting with generalized weakness, decreased activity tolerance and decreased dynamic balance. Pt requiring maxA+2 for sit<>stand this session and use of Stedy for safe functional transfers as pt with increased difficulty attempting to perform stand pivot transfers using RW, also with increased difficulty  maintaining TDWB through LLE requiring cues and assist for adherence. Pt currently requires minguardA for seated UB ADLs, max-totalA (+2) for LB ADLs. Pt will benefit from continued acute OT services and recommend follow up therapy services in SNF setting at time of discharge to maximize his safety and independence with ADLs and mobility prior to return home. Will follow.     Follow Up Recommendations  SNF;Supervision/Assistance - 24 hour    Equipment Recommendations  3 in 1 bedside commode;Other (comment)(TBD in next venue )           Precautions / Restrictions Precautions Precautions: Fall Precaution Comments: Very HOH - pt reports L ear is better than R however both seemed pretty impaired in bilateral ears Restrictions Weight Bearing Restrictions: Yes LLE Weight Bearing: Touchdown weight bearing      Mobility Bed Mobility Overal bed mobility: Needs Assistance Bed  Mobility: Sit to Supine       Sit to supine: Mod assist;+2 for physical assistance   General bed mobility comments: Assist for LE elevation and trunk lower for return to supine.  Transfers Overall transfer level: Needs assistance Equipment used: Rolling walker (2 wheeled) Transfers: Sit to/from Stand Sit to Stand: Max assist;+2 physical assistance         General transfer comment: Initially attempted with RW. Third person at lower leg level to ensure TDWB. Pt unable to advance LE'S with walker so attempted with Stedy. Pt was able to pull to stand with greater ease but still +2 max overall to complete. Pt with difficulty maintaining TDWB in LLE.     Balance Overall balance assessment: Needs assistance;History of Falls Sitting-balance support: Feet supported;No upper extremity supported Sitting balance-Leahy Scale: Fair     Standing balance support: Bilateral upper extremity supported;During functional activity Standing balance-Leahy Scale: Zero Standing balance comment: +2                           ADL either performed or assessed with clinical judgement   ADL Overall ADL's : Needs assistance/impaired Eating/Feeding: Set up;Sitting   Grooming: Set up;Sitting   Upper Body Bathing: Min guard;Sitting   Lower Body Bathing: Maximal assistance;+2 for physical assistance;+2 for safety/equipment;Sit to/from stand;Sitting/lateral leans   Upper Body Dressing : Min guard;Sitting Upper Body Dressing Details (indicate cue type and reason): minguard for sitting balance  Lower Body Dressing: Total assistance;+2 for physical assistance;+2 for safety/equipment;Sit to/from stand;Sitting/lateral leans   Toilet Transfer: Maximal assistance;Total assistance;+2 for physical assistance;+2 for safety/equipment;BSC;RW Toilet  Transfer Details (indicate cue type and reason): simulated in transfer recliner to EOB; use of Stedy for transfer this session with maxA+2 for  sit<>stand Toileting- Clothing Manipulation and Hygiene: Total assistance;+2 for physical assistance;+2 for safety/equipment;Sit to/from stand;Sitting/lateral lean       Functional mobility during ADLs: Maximal assistance;+2 for physical assistance;+2 for safety/equipment;Rolling walker(for sit<>stand; use of Stedy for transfer ) General ADL Comments: pt completed sit<>Stand at [facility] though not able to safely take steps today; use of Stedy for functional transfers, pt requires cues to maintain WB status in LLE      Vision         Perception     Praxis      Pertinent Vitals/Pain Pain Assessment: Faces Faces Pain Scale: Hurts even more Pain Location: L LE in general  Pain Descriptors / Indicators: Aching;Discomfort;Sore Pain Intervention(s): Monitored during session;Repositioned;Limited activity within patient's tolerance     Hand Dominance     Extremity/Trunk Assessment Upper Extremity Assessment Upper Extremity Assessment: Generalized weakness   Lower Extremity Assessment Lower Extremity Assessment: Defer to PT evaluation   Cervical / Trunk Assessment Cervical / Trunk Assessment: Kyphotic   Communication Communication Communication: HOH   Cognition Arousal/Alertness: Awake/alert Behavior During Therapy: WFL for tasks assessed/performed Overall Cognitive Status: Within Functional Limits for tasks assessed                                 General Comments: requires some repeated commands but this seems to be due to [facility] rather than confusion   General Comments  Noted a skin tear on buttock during sit<>stand activity. RN notified and present to assess and place pink foam dressing.     Exercises     Shoulder Instructions      Home Living Family/patient expects to be discharged to:: Private residence Living Arrangements: Alone Available Help at Discharge: Family;Available PRN/intermittently Type of Home: House Home Access: Level entry     Home Layout:  One level     Bathroom Shower/Tub: Teacher, early years/pre: Standard     Home Equipment: Environmental consultant - 2 wheels;Bedside commode          Prior Functioning/Environment Level of Independence: Independent with assistive device(s)        Comments: uses RW; per chart review family states that he had been more dependent on them for standing up from chairs and transfers recently         OT Problem List: Decreased strength;Impaired balance (sitting and/or standing);Pain;Decreased knowledge of precautions;Decreased range of motion;Decreased activity tolerance;Decreased knowledge of use of DME or AE      OT Treatment/Interventions: Self-care/ADL training;DME and/or AE instruction;Therapeutic activities;Balance training;Therapeutic exercise;Patient/family education    OT Goals(Current goals can be found in the care plan section) Acute Rehab OT Goals Patient Stated Goal: to get up and walk  OT Goal Formulation: With patient Time For Goal Achievement: 10/31/17 Potential to Achieve Goals: Good  OT Frequency: Min 2X/week   Barriers to D/C:            Co-evaluation PT/OT/SLP Co-Evaluation/Treatment: Yes Reason for Co-Treatment: For patient/therapist safety;To address functional/ADL transfers PT goals addressed during session: Mobility/safety with mobility;Balance;Proper use of DME OT goals addressed during session: ADL's and self-care;Proper use of Adaptive equipment and DME      AM-PAC PT "6 Clicks" Daily Activity     Outcome Measure Help from another person eating meals?: None Help from another person taking  care of personal grooming?: A Little Help from another person toileting, which includes using toliet, bedpan, or urinal?: Total Help from another person bathing (including washing, rinsing, drying)?: A Lot Help from another person to put on and taking off regular upper body clothing?: A Little Help from another person to put on and taking off regular lower body  clothing?: Total 6 Click Score: 14   End of Session Equipment Utilized During Treatment: Gait belt;Rolling walker;Other (comment)(Stedy ) Nurse Communication: Mobility status;Need for lift equipment  Activity Tolerance: Patient tolerated treatment well Patient left: in bed;with call bell/phone within reach;with bed alarm set;with family/visitor present  OT Visit Diagnosis: Unsteadiness on feet (R26.81);History of falling (Z91.81)                Time: 4784-1282 OT Time Calculation (min): 35 min Charges:  OT General Charges $OT Visit: 1 Visit OT Evaluation $OT Eval Moderate Complexity: 1 Mod  Lou Cal, [place] Pager [phone] 10/17/2017   Duane Spears 10/17/2017, 3:21 PM

## 2017-10-17 NOTE — Progress Notes (Signed)
Physical Therapy Treatment Patient Details Name: Duane Spears MRN: [id number] DOB: 1920/05/13 Today's Date: 10/17/2017    History of Present Illness 84 y/o male brought to the ED after falling at home while sweeping. Of note he did recently have his L hip replaced. Imaging is positive for minimally displaced femur fracture with extension to the femoral component of L THR. Per ortho, he is to have non-operative management. PMH A-fib, bradycardia, CHF, hip replacement     PT Comments    Pt progressing towards physical therapy goals. Seen today in conjunction with OT to maximize safety during attempts of functional mobility. Both RW and Stedy utilized for sit<>stand today, a total of 4 times throughout session. Although +2 max assist provided throughout session, pt was more successful with Stedy. Increased cues for TDWB with Stedy use required throughout transfers. Family present throughout session and it appears pt and family are agreeable to SNF stay prior to return home.   Follow Up Recommendations  SNF;Supervision/Assistance - 24 hour     Equipment Recommendations  Other (comment)(defer to next venue )    Recommendations for Other Services       Precautions / Restrictions Precautions Precautions: Fall Precaution Comments: Very HOH - pt reports L ear is better than R, however seemed pretty impaired in bilateral ears Restrictions Weight Bearing Restrictions: Yes LLE Weight Bearing: Touchdown weight bearing    Mobility  Bed Mobility Overal bed mobility: Needs Assistance Bed Mobility: Sit to Supine       Sit to supine: Mod assist;+2 for physical assistance   General bed mobility comments: Assist for LE elevation and trunk lower for return to supine.  Transfers Overall transfer level: Needs assistance Equipment used: Rolling walker (2 wheeled) Transfers: Sit to/from Stand Sit to Stand: Max assist;+2 physical assistance         General transfer comment: Initially  attempted with RW. Third person at lower leg level to ensure TDWB. Pt unable to advance LE'S with walker so attempted with Stedy. Pt was able to pull to stand with greater ease but still +2 max overall to complete.  Ambulation/Gait             General Gait Details: unable    Stairs             Wheelchair Mobility    Modified Rankin (Stroke Patients Only)       Balance Overall balance assessment: Needs assistance;History of Falls Sitting-balance support: Feet supported;No upper extremity supported Sitting balance-Leahy Scale: Fair     Standing balance support: Bilateral upper extremity supported;During functional activity Standing balance-Leahy Scale: Zero Standing balance comment: +2                            Cognition Arousal/Alertness: Awake/alert Behavior During Therapy: WFL for tasks assessed/performed Overall Cognitive Status: Within Functional Limits for tasks assessed                                        Exercises      General Comments General comments (skin integrity, edema, etc.): Noted a skin tear on buttock during sit<>stand activity. RN notified and present to assess and place pink foam dressing.       Pertinent Vitals/Pain Pain Assessment: Faces Faces Pain Scale: Hurts even more Pain Location: L LE in general  Pain Descriptors / Indicators: Aching;Discomfort;Sore Pain  Intervention(s): Monitored during session;Repositioned;Limited activity within patient's tolerance    Home Living                      Prior Function            PT Goals (current goals can now be found in the care plan section) Acute Rehab PT Goals Patient Stated Goal: to get up and walk  PT Goal Formulation: With patient/family Time For Goal Achievement: 10/30/17 Potential to Achieve Goals: Fair Progress towards PT goals: Progressing toward goals    Frequency    Min 3X/week      PT Plan Current plan remains appropriate     Co-evaluation PT/OT/SLP Co-Evaluation/Treatment: Yes Reason for Co-Treatment: For patient/therapist safety;To address functional/ADL transfers PT goals addressed during session: Mobility/safety with mobility;Balance;Proper use of DME        AM-PAC PT "6 Clicks" Daily Activity  Outcome Measure  Difficulty turning over in bed (including adjusting bedclothes, sheets and blankets)?: Unable Difficulty moving from lying on back to sitting on the side of the bed? : Unable Difficulty sitting down on and standing up from a chair with arms (e.g., wheelchair, bedside commode, etc,.)?: Unable Help needed moving to and from a bed to chair (including a wheelchair)?: Total Help needed walking in hospital room?: Total Help needed climbing 3-5 steps with a railing? : Total 6 Click Score: 6    End of Session Equipment Utilized During Treatment: Gait belt Activity Tolerance: Patient tolerated treatment well Patient left: in bed;with bed alarm set;with family/visitor present;with call bell/phone within reach Nurse Communication: Mobility status PT Visit Diagnosis: Muscle weakness (generalized) (M62.81);History of falling (Z91.81);Difficulty in walking, not elsewhere classified (R26.2)     Time: 6789-3810 PT Time Calculation (min) (ACUTE ONLY): 34 min  Charges:  $Gait Training: 8-22 mins                     Rolinda Roan, PT, DPT Acute Rehabilitation Services Pager: [phone]    Thelma Comp 10/17/2017, 1:31 PM

## 2017-10-17 NOTE — Clinical Social Work Note (Signed)
Clinical Social Work Assessment  Patient Details  Name: Duane Spears MRN: [id number] Date of Birth: 26-Sep-1920  Date of referral:  10/17/17               Reason for consult:  Facility Placement                Permission sought to share information with:  Facility Art therapist granted to share information::  Yes, Verbal Permission Granted  Name::     Duane Spears   Agency::     Relationship::  Son  Sport and exercise psychologist Information:  [phone]  Housing/Transportation Living arrangements for the past 2 months:  Single Family Home Source of Information:  Adult Children Patient Interpreter Needed:  None Criminal Activity/Legal Involvement Pertinent to Current Situation/Hospitalization:  No - Comment as needed Significant Relationships:  Adult Children Lives with:  Self Do you feel safe going back to the place where you live?  No Need for family participation in patient care:  Yes (Comment)  Care giving concerns:  CSW received consult for discharge needs. CSW spoke with patient and his son, Duane Spears, regarding PT recommendation of SNF placement at time of discharge. Patient's son reported that his father lives home alone.   Social Worker assessment / plan:  CSW spoke with patient and family concerning possibility of rehab at [facility] before returning home.  Employment status:  Retired Forensic scientist:  Medicare PT Recommendations:  [place] / Referral to community resources:  [place]  Patient/Family's Response to care:  Patient and  family recognizes need for rehab before returning home and is agreeable to a SNF in [place]. Family expressed that they want him to get well and be able to return home because that is their father's wish. Patient's son, Duane Spears states that the patient has family and friends that will be there to assist their father in the home once he returns.  Patient and family reported preference for [facility]  ([place]).   Patient/Family's Understanding of and Emotional Response to Diagnosis, Current Treatment, and Prognosis:  Patient/family is realistic regarding therapy needs and expressed being hopeful for SNF placement. Patient and family expressed understanding of CSW role and discharge process as well as medical condition. No questions/concerns about plan or treatment.   Emotional Assessment Appearance:  Appears stated age Attitude/Demeanor/Rapport:  (patiet was resting) Affect (typically observed):  Quiet Orientation:  Oriented to Self, Oriented to Place, Oriented to  Time, Oriented to Situation Alcohol / Substance use:  Not Applicable Psych involvement (Current and /or in the community):  No (Comment)  Discharge Needs  Concerns to be addressed:  Basic Needs, Care Coordination Readmission within the last 30 days:    Current discharge risk:  None Barriers to Discharge:  Continued Medical Work up   [employer], [place] 10/17/2017, 3:51 PM

## 2017-10-17 NOTE — Progress Notes (Signed)
[facility] PROGRESS NOTE  Duane Spears BTD:[id number] DOB: [birth year] DOA: 10/16/2017 PCP: Levin Erp, MD   Narrative: 60 ? 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  Glucose tolerance-not sure would control aggressively at 84 years old-defer to PCP  Mild thrombocytopenia?  Myelofibrosis-again not sure if would offer any adjunctive therapies-defer to PCP    DVT prophylaxis: Lovenox code Status: DNR family Communication: The members bedside disposition Plan: Skilled placement when able   Verlon Au, MD  [facility] Direct contact: [phone] --[place] [place]  --[email or web address]; password [email or web address]  7PM-7AM contact night coverage as above 10/17/2017, 4:52 PM  LOS: 82 days   Consultants:  Orthopedics  Procedures:  On  Antimicrobials:  None  Interval history/Subjective: -Awake and very hard of hearing and also very talkative but not in distress not in pain had a full lunch   Objective:  Vitals:  Vitals:   10/17/17 0346 10/17/17 1516  BP: (!) 123/59 113/72  Pulse: (!) 57 (!) 49  Resp: 16 17  Temp: 98.6 F (37 C) 97.8 F (36.6 C)  SpO2: 94% 98%    Exam:  No icterus no pallor, arcus senilis no submandibular lymphadenopathy no JVD no bruit S1-S2 irregularly irregular but rate  controlled Chest clinically clear Abdomen soft nontender Trace lower extremity edema   I have personally reviewed the following:   Labs:  BUN/creatinine 26/1.07 calcium 8.4  Platelet 124, hemoglobin 12.1, glucose 161  Imaging studies:  None at this time  Medical tests:  None  Test discussed with performing physician:  No  Decision to obtain old records:  Yes  Review and summation of old records:  Tentatively summarized  Scheduled Meds: . finasteride  5 mg Oral Daily  . furosemide  100 mg Oral Daily   And  . furosemide  60 mg Oral Daily  . heparin  5,000 Units Subcutaneous Q8H  . losartan  50 mg Oral Daily   Continuous Infusions:  Principal Problem:   Periprosthetic fracture around internal prosthetic left hip joint (HCC) Active Problems:   Atrial fibrillation (HCC)   Benign prostatic hyperplasia   Left displaced femoral neck fracture (HCC)   LOS: 0 days

## 2017-10-17 NOTE — Plan of Care (Signed)

## 2017-10-18 DIAGNOSIS — I509 Heart failure, unspecified: Secondary | ICD-10-CM | POA: Diagnosis not present

## 2017-10-18 DIAGNOSIS — R279 Unspecified lack of coordination: Secondary | ICD-10-CM | POA: Diagnosis not present

## 2017-10-18 DIAGNOSIS — L89610 Pressure ulcer of right heel, unstageable: Secondary | ICD-10-CM | POA: Diagnosis not present

## 2017-10-18 DIAGNOSIS — R41841 Cognitive communication deficit: Secondary | ICD-10-CM | POA: Diagnosis not present

## 2017-10-18 DIAGNOSIS — M62838 Other muscle spasm: Secondary | ICD-10-CM | POA: Diagnosis not present

## 2017-10-18 DIAGNOSIS — K5900 Constipation, unspecified: Secondary | ICD-10-CM | POA: Diagnosis not present

## 2017-10-18 DIAGNOSIS — M9702XD Periprosthetic fracture around internal prosthetic left hip joint, subsequent encounter: Secondary | ICD-10-CM | POA: Diagnosis not present

## 2017-10-18 DIAGNOSIS — N400 Enlarged prostate without lower urinary tract symptoms: Secondary | ICD-10-CM | POA: Diagnosis not present

## 2017-10-18 DIAGNOSIS — S30810D Abrasion of lower back and pelvis, subsequent encounter: Secondary | ICD-10-CM | POA: Diagnosis not present

## 2017-10-18 DIAGNOSIS — Z7901 Long term (current) use of anticoagulants: Secondary | ICD-10-CM | POA: Diagnosis not present

## 2017-10-18 DIAGNOSIS — L89620 Pressure ulcer of left heel, unstageable: Secondary | ICD-10-CM | POA: Diagnosis not present

## 2017-10-18 DIAGNOSIS — R2681 Unsteadiness on feet: Secondary | ICD-10-CM | POA: Diagnosis not present

## 2017-10-18 DIAGNOSIS — S31000D Unspecified open wound of lower back and pelvis without penetration into retroperitoneum, subsequent encounter: Secondary | ICD-10-CM | POA: Diagnosis not present

## 2017-10-18 DIAGNOSIS — W19XXXD Unspecified fall, subsequent encounter: Secondary | ICD-10-CM | POA: Diagnosis not present

## 2017-10-18 DIAGNOSIS — Z743 Need for continuous supervision: Secondary | ICD-10-CM | POA: Diagnosis not present

## 2017-10-18 DIAGNOSIS — S72002A Fracture of unspecified part of neck of left femur, initial encounter for closed fracture: Secondary | ICD-10-CM | POA: Diagnosis not present

## 2017-10-18 DIAGNOSIS — I5030 Unspecified diastolic (congestive) heart failure: Secondary | ICD-10-CM | POA: Diagnosis not present

## 2017-10-18 DIAGNOSIS — I10 Essential (primary) hypertension: Secondary | ICD-10-CM

## 2017-10-18 DIAGNOSIS — H9193 Unspecified hearing loss, bilateral: Secondary | ICD-10-CM | POA: Diagnosis not present

## 2017-10-18 DIAGNOSIS — E119 Type 2 diabetes mellitus without complications: Secondary | ICD-10-CM | POA: Diagnosis not present

## 2017-10-18 DIAGNOSIS — L89613 Pressure ulcer of right heel, stage 3: Secondary | ICD-10-CM | POA: Diagnosis not present

## 2017-10-18 DIAGNOSIS — R4181 Age-related cognitive decline: Secondary | ICD-10-CM | POA: Diagnosis not present

## 2017-10-18 DIAGNOSIS — S72355D Nondisplaced comminuted fracture of shaft of left femur, subsequent encounter for closed fracture with routine healing: Secondary | ICD-10-CM | POA: Diagnosis not present

## 2017-10-18 DIAGNOSIS — R52 Pain, unspecified: Secondary | ICD-10-CM | POA: Diagnosis not present

## 2017-10-18 DIAGNOSIS — R0989 Other specified symptoms and signs involving the circulatory and respiratory systems: Secondary | ICD-10-CM | POA: Diagnosis not present

## 2017-10-18 DIAGNOSIS — M9702XA Periprosthetic fracture around internal prosthetic left hip joint, initial encounter: Secondary | ICD-10-CM | POA: Diagnosis not present

## 2017-10-18 DIAGNOSIS — I4891 Unspecified atrial fibrillation: Secondary | ICD-10-CM | POA: Diagnosis not present

## 2017-10-18 DIAGNOSIS — M6281 Muscle weakness (generalized): Secondary | ICD-10-CM | POA: Diagnosis not present

## 2017-10-18 DIAGNOSIS — J189 Pneumonia, unspecified organism: Secondary | ICD-10-CM | POA: Diagnosis not present

## 2017-10-18 DIAGNOSIS — N390 Urinary tract infection, site not specified: Secondary | ICD-10-CM | POA: Diagnosis not present

## 2017-10-18 DIAGNOSIS — S31000A Unspecified open wound of lower back and pelvis without penetration into retroperitoneum, initial encounter: Secondary | ICD-10-CM | POA: Diagnosis not present

## 2017-10-18 LAB — MAGNESIUM: MAGNESIUM: 2.1 mg/dL (ref 1.7–2.4)

## 2017-10-18 MED ORDER — BISACODYL 5 MG PO TBEC: 5.0000 mg | DELAYED_RELEASE_TABLET | Freq: Every day | ORAL | 0 refills | Status: AC | PRN

## 2017-10-18 MED ORDER — METHOCARBAMOL 500 MG PO TABS: 500.0000 mg | ORAL_TABLET | Freq: Three times a day (TID) | ORAL | Status: DC | PRN

## 2017-10-18 MED ORDER — HYDROCODONE-ACETAMINOPHEN 5-325 MG PO TABS: 1.0000 | ORAL_TABLET | Freq: Four times a day (QID) | ORAL | 0 refills | Status: AC | PRN

## 2017-10-18 MED ORDER — METHOCARBAMOL 500 MG PO TABS: 500.0000 mg | ORAL_TABLET | Freq: Three times a day (TID) | ORAL | 0 refills | Status: AC | PRN

## 2017-10-18 MED ORDER — HEPARIN SODIUM (PORCINE) 5000 UNIT/ML IJ SOLN: 5000.0000 [IU] | Freq: Three times a day (TID) | INTRAMUSCULAR | Status: AC

## 2017-10-18 MED ORDER — METHOCARBAMOL 500 MG PO TABS
500.0000 mg | ORAL_TABLET | Freq: Once | ORAL | Status: AC
  Administered 2017-10-18: 500 mg via ORAL
  Filled 2017-10-18: qty 1

## 2017-10-18 MED ORDER — SENNOSIDES-DOCUSATE SODIUM 8.6-50 MG PO TABS: 1.0000 | ORAL_TABLET | Freq: Every day | ORAL | Status: AC

## 2017-10-18 NOTE — Progress Notes (Signed)
Physical Therapy Treatment Patient Details Name: Duane Spears MRN: [id number] DOB: 1920-09-22 Today's Date: 10/18/2017    History of Present Illness 84 y/o male brought to the ED after falling at home while sweeping. Of note he did recently have his L hip replaced. Imaging is positive for minimally displaced femur fracture with extension to the femoral component of L THR. Per ortho, he is to have non-operative management. PMH A-fib, bradycardia, CHF, hip replacement     PT Comments    Pt continues to make steady progress towards goals. Pt requires mod A +2 for bed mobility and VC for hand placement to assist with trunk and R LE elevation. Pt continues to require stedy for transfers for pt/therapist safety and max +2 initially for sit<>stand. After 2-3 trials pt able to stand with mod A +2, with VC for hand placement and improvement in posture in order to maneuver flaps into place behind hips. Pt performed supine therapeutic exercise, with good tolerance reporting "the more he did the easier it got". Pt anticipates being d/c to SNF this afternoon.  Patient will benefit from skilled PT to increase their independence and safety with mobility to allow discharge to the venue listed below.      Follow Up Recommendations  SNF;Supervision/Assistance - 24 hour     Equipment Recommendations  Other (comment)(TBD by next venue of care)    Recommendations for Other Services       Precautions / Restrictions Precautions Precautions: Fall Precaution Comments: Very HOH - pt reports L ear is better than R however seemed pretty impaired in bilateral ears Restrictions Weight Bearing Restrictions: Yes LLE Weight Bearing: Touchdown weight bearing    Mobility  Bed Mobility Overal bed mobility: Needs Assistance Bed Mobility: Sit to Supine Rolling: Mod assist   Supine to sit: Mod assist;+2 for physical assistance     General bed mobility comments: Pt initiated R LE movement off EOB with assistance  with L LE elevation digressing to bil LE assistance to get legs completely off bed; while mod A was used for trunk elevation with VC and assistance for hand placement in order for pt to finish pulling to sit with bed rail   Transfers Overall transfer level: Needs assistance   Transfers: Sit to/from Stand;Stand Pivot Transfers Sit to Stand: Max assist;Mod assist;+2 physical assistance Stand pivot transfers: Max assist;+2 physical assistance       General transfer comment: Initially attempted stand pivot to [facility] with max A +2, but pt unable to move R LE into correct place without PT guiding foot with hers. Pt began to lose grip on therapist arms so sat pt back on bed. Repeated stand pivot with stedy with VC for pt to stand upright to get positioned correctly over BSC. Pt transfered with Mod A +2 from [facility] to chair with stedy, with min cues for hand placement.    Ambulation/Gait             General Gait Details: unable    Stairs             Wheelchair Mobility    Modified Rankin (Stroke Patients Only)       Balance Overall balance assessment: Needs assistance;History of Falls Sitting-balance support: Feet supported;No upper extremity supported Sitting balance-Leahy Scale: Fair     Standing balance support: Bilateral upper extremity supported;During functional activity Standing balance-Leahy Scale: Zero Standing balance comment: +2  Cognition Arousal/Alertness: Awake/alert Behavior During Therapy: WFL for tasks assessed/performed Overall Cognitive Status: Within Functional Limits for tasks assessed                                        Exercises Total Joint Exercises Quad Sets: AROM;Strengthening;Left;Supine;10 reps Short Arc Quad: AROM;Strengthening;Left;Supine;15 reps Hip ABduction/ADduction: AAROM;Left;20 reps;Sidelying(20 abd/ 20add)    General Comments        Pertinent Vitals/Pain Pain Assessment:  0-10 Pain Score: 7  Pain Descriptors / Indicators: Aching;Discomfort;Sore;Grimacing Pain Intervention(s): Limited activity within patient's tolerance;Monitored during session;Repositioned    Home Living                      Prior Function            PT Goals (current goals can now be found in the care plan section) Acute Rehab PT Goals Patient Stated Goal: to get up and walk  PT Goal Formulation: With patient/family Time For Goal Achievement: 10/30/17 Potential to Achieve Goals: Fair Progress towards PT goals: Progressing toward goals    Frequency    Min 3X/week      PT Plan Current plan remains appropriate    Co-evaluation              AM-PAC PT "6 Clicks" Daily Activity  Outcome Measure  Difficulty turning over in bed (including adjusting bedclothes, sheets and blankets)?: Unable Difficulty moving from lying on back to sitting on the side of the bed? : Unable Difficulty sitting down on and standing up from a chair with arms (e.g., wheelchair, bedside commode, etc,.)?: Unable Help needed moving to and from a bed to chair (including a wheelchair)?: Total Help needed walking in hospital room?: Total Help needed climbing 3-5 steps with a railing? : Total 6 Click Score: 6    End of Session Equipment Utilized During Treatment: Gait belt Activity Tolerance: Patient tolerated treatment well Patient left: with family/visitor present;in chair;with call bell/phone within reach Nurse Communication: Mobility status PT Visit Diagnosis: Muscle weakness (generalized) (M62.81);History of falling (Z91.81);Difficulty in walking, not elsewhere classified (R26.2)     Time: 1400-1443 PT Time Calculation (min) (ACUTE ONLY): 43 min  Charges:  $Gait Training: 38-52 mins                     Einar Crow, [place]  Student Physical Therapist Acute Rehab [phone]    Einar Crow 10/18/2017, 3:05 PM

## 2017-10-18 NOTE — Plan of Care (Signed)
  Problem: Activity: Goal: Risk for activity intolerance will decrease Outcome: Progressing   Problem: Pain Managment: Goal: General experience of comfort will improve Outcome: Progressing   Problem: Safety: Goal: Ability to remain free from injury will improve Outcome: Adequate for Discharge

## 2017-10-18 NOTE — Clinical Social Work Placement (Signed)
   CLINICAL SOCIAL WORK PLACEMENT  NOTE  Date:  10/18/2017  Patient Details  Name: Duane Spears MRN: [id number] Date of Birth: 03-May-1920  Clinical Social Work is seeking post-discharge placement for this patient at the   level of care (*CSW will initial, date and re-position this form in  chart as items are completed):  Yes   Patient/family provided with [place] Work Department's list of facilities offering this level of care within the geographic area requested by the patient (or if unable, by the patient's family).  Yes   Patient/family informed of their freedom to choose among providers that offer the needed level of care, that participate in Medicare, Medicaid or managed care program needed by the patient, have an available bed and are willing to accept the patient.  Yes   Patient/family informed of [facility]'s ownership interest in [facility] and [facility], as well as of the fact that they are under no obligation to receive care at these facilities.  PASRR submitted to EDS on       PASRR number received on       Existing PASRR number confirmed on 10/17/17     FL2 transmitted to all facilities in geographic area requested by pt/family on 10/17/17     FL2 transmitted to all facilities within larger geographic area on       Patient informed that his/her managed care company has contracts with or will negotiate with certain facilities, including the following:        Yes   Patient/family informed of bed offers received.  Patient chooses bed at [place], [place]     Physician recommends and patient chooses bed at      Patient to be transferred to [facility] on 10/18/17.  Patient to be transferred to facility by [facility]     Patient family notified on 10/18/17 of transfer.  Name of family member notified:  Rodney(son)     PHYSICIAN       Additional Comment:    _______________________________________________ Vinie Sill, [employer] 10/18/2017, 3:58 PM

## 2017-10-18 NOTE — Plan of Care (Signed)

## 2017-10-18 NOTE — Progress Notes (Signed)
Report called and given to Renee at [facility]. All belongings gathered to be sent home. [facility] to transport. Family at bedside an aware.

## 2017-10-18 NOTE — Progress Notes (Signed)
     Subjective:  Patient reports pain as mild.  Verbalizes understanding of touchdown weightbearing.  Objective:   VITALS:   Vitals:   10/17/17 0346 10/17/17 1516 10/17/17 2009 10/18/17 0408  BP: (!) 123/59 113/72 130/72 136/72  Pulse: (!) 57 (!) 49 (!) 50 67  Resp: 16 17 16 16   Temp: 98.6 F (37 C) 97.8 F (36.6 C) 98 F (36.7 C) 98.6 F (37 C)  TempSrc: Oral Oral Oral Oral  SpO2: 94% 98% 98% 94%  Weight:      Height:       CBC Latest Ref Rng & Units 10/17/2017 10/16/2017 04/05/2015  WBC 4.0 - 10.5 K/uL 7.9 6.6 7.3  Hemoglobin 13.0 - 17.0 g/dL 12.1(L) 12.9(L) 14.8  Hematocrit 39.0 - 52.0 % 36.6(L) 39.2 44.3  Platelets 150 - 400 K/uL 124(L) 124(L) 117(L)   BMP Latest Ref Rng & Units 10/17/2017 10/16/2017 04/05/2015  Glucose 70 - 99 mg/dL 161(H) 172(H) 180(H)  BUN 8 - 23 mg/dL 26(H) 26(H) 27(H)  Creatinine 0.61 - 1.24 mg/dL 1.07 1.03 1.05  Sodium 135 - 145 mmol/L 141 140 144  Potassium 3.5 - 5.1 mmol/L 4.0 3.8 4.4  Chloride 98 - 111 mmol/L 105 104 106  CO2 22 - 32 mmol/L 26 25 25   Calcium 8.9 - 10.3 mg/dL 8.4(L) 8.7(L) 9.2   Intake/Output      08/06 0701 - 08/07 0700   P.O. 840   Total Intake(mL/kg) 840 (8.3)   Urine (mL/kg/hr) 2070 (0.9)   Total Output 2070   Net -1230         Physical Exam General: NAD.  Upright in bed on arrival.  Calm, conversant.  No increased work of breathing. LLE: Mild tenderness left lateral hip.  Neurovascular intact distally.  Sensation intact distally.   Assessment / Plan: Principal Problem:   Periprosthetic fracture around internal prosthetic left hip joint (HCC) Active Problems:   Atrial fibrillation (HCC)   Benign prostatic hyperplasia   Left displaced femoral neck fracture (HCC)   Left hip periprosthetic fracture Case and images reviewed by Dr. Alain Marion.  Fracture/components appear stable. Continue plan for nonoperative management. Touchdown weightbearing LLE. Mobilize with therapies Follow up in the office with Dr. Alain Marion in 1-2 weeks.  Please call with questions.   Charna Elizabeth Martensen III, PA-C 10/18/2017, 6:40 AM

## 2017-10-18 NOTE — Progress Notes (Signed)
Pt will DC to: [facility] Date: 10/18/2017 Family notified:Rodeny(son) and daughter in-law Transport by: Corey Harold  RN, patient, and facility notified of DC. Discharge Summary sent to facility. RN given number for report [phone] room 103-B. DC packet on chart. Ambulance transport requested for patient.   CSW signing off.  Thurmond Butts, [place] Social Worker [phone]

## 2017-10-18 NOTE — Discharge Summary (Signed)
Physician Discharge Summary  Duane Spears RXV:[id number] DOB: 1920/11/05 DOA: 10/16/2017  PCP: Levin Erp, MD  Admit date: 10/16/2017 Discharge date: 10/18/2017  Time spent: 60 minutes  Recommendations for Outpatient Follow-up:  1. Discharge to a skilled nursing facility.  Follow-up with Dr. Percell Miller, orthopedics in 1 to 2 weeks. 2. Follow-up with MD at skilled nursing facility.  Patient will need a basic metabolic profile done in 1 week to follow-up on electrolytes and renal function.   Discharge Diagnoses:  Principal Problem:   Periprosthetic fracture around internal prosthetic left hip joint (HCC) Active Problems:   Atrial fibrillation (HCC)   Benign prostatic hyperplasia   Left displaced femoral neck fracture (HCC)   Discharge Condition: Stable  Diet recommendation: Heart healthy  Filed Weights   10/16/17 0754  Weight: 101.2 kg (223 lb)    History of present illness:  Per Dr. Ralene Muskrat is a 84 y.o. male atrial fibrillation, chronic bradycardia, congestive heart failure, BPH, who was brought to the emergency department after sustaining a mechanical fall.  The patient had a recent left hip replacement, and prior to admission, he was very mobile.  He lives at home alone.  He was cleaning the floors, on a wet surface, falling backwards.  He denied hitting his head.  He denied any neck pain.  No vision changes.  The patient did not sustain any syncopal episode, or presyncope.  He denied any dizziness or vertigo.  Immediately, he felt pain on the left hip, but he could still move it despite the pain.  He denied any recent illnesses.  He denied any bleeding issues.  He denied any nausea, vomiting, diarrhea, abdominal pain, he denies any dysuria or gross hematuria.  He denied any worsening lower extremity swelling or calf pain.  He did have intermittent cramps.  He is hard of hearing, but no confusion was reported.  He was able to move the rest of his extremities without  difficulty.   ED Course:  BP (!) 136/100   Pulse (!) 52   Temp 98.3 F (36.8 C)   Resp (!) 23   Ht 6\' 2"  (1.88 m)   Wt 101.2 kg (223 lb)   SpO2 99%   BMI 28.63 kg/m   Orthopedic consultation was obtained.  They confirm a left periprosthetic hip fracture, but the plan is nonoperative management, with touchdown weightbearing to the left lower extremity.  The patient received pain medications with good control of the symptoms. Platelets 124 White count 6.6, hemoglobin 12.9. Left hip x-ray shows Minimally displaced fracture involving the posterolateral aspect of the proximal diaphysis of the femur with extension to the femoral component of the left total hip replacement EKG shows atrial fibrillation, with bradycardia, which is chronic, rate 59.    Hospital Course:  #1 periprosthetic fracture around internal prosthetic left hip joint Secondary to mechanical fall.  Patient had presented with left hip pain.  Plain films which were done showed a minimally displaced fracture involving the posterior lateral aspect of the proximal diaphysis of the femur with extension to the femoral component of the left total hip replacement.  Orthopedics was consulted and patient was seen in consultation by Dr. Percell Miller.  It was recommended for nonoperative management, touchdown weightbearing of the left lower extremity.  Patient was seen by PT OT during the hospitalization and patient be discharged to a skilled nursing facility.  Patient will follow up with Dr. Percell Miller of orthopedics in 1 to 2 weeks.  2.  Chronic  diastolic heart failure Patient was euvolemic throughout the hospitalization.  Patient maintained on home regimen of Cozaar, Lasix outpatient follow-up.  3.  Atrial fibrillation CHA2DS2VASC score > 5.  Patient's rate was controlled throughout the hospitalization.  Patient not on anticoagulation.  Outpatient follow-up.  4.  BPH Remained stable.  Patient maintained on home regimen of Proscar.  5.   Hypertension Remained stable on home regimen of losartan.  Procedures:  Plain films left hip and pelvis 10/16/2017  Consultations:  Orthopedics: Dr. Edmonia Lynch 10/16/2017.  Discharge Exam: Vitals:   10/17/17 2009 10/18/17 0408  BP: 130/72 136/72  Pulse: (!) 50 67  Resp: 16 16  Temp: 98 F (36.7 C) 98.6 F (37 C)  SpO2: 98% 94%    General: NAD Cardiovascular: RRR Respiratory: CTAB  Discharge Instructions   Discharge Instructions    Diet - low sodium heart healthy   Complete by:  As directed    Increase activity slowly   Complete by:  As directed      Allergies as of 10/18/2017   No Known Allergies     Medication List    TAKE these medications   bisacodyl 5 MG EC tablet Commonly known as:  DULCOLAX Take 1 tablet (5 mg total) by mouth daily as needed for moderate constipation.   finasteride 5 MG tablet Commonly known as:  PROSCAR Take 5 mg by mouth daily.   furosemide 20 MG tablet Commonly known as:  LASIX Take 60-100 mg by mouth 2 (two) times daily. 100mg  (5 tablets) in the AM and 60mg  (3 tablets) in the evening   heparin 5000 UNIT/ML injection Inject 1 mL (5,000 Units total) into the skin every 8 (eight) hours.   HYDROcodone-acetaminophen 5-325 MG tablet Commonly known as:  NORCO/VICODIN Take 1-2 tablets by mouth every 6 (six) hours as needed for moderate pain.   losartan 50 MG tablet Commonly known as:  COZAAR Take 1 tablet by mouth daily.   methocarbamol 500 MG tablet Commonly known as:  ROBAXIN Take 1 tablet (500 mg total) by mouth every 8 (eight) hours as needed for muscle spasms.   multivitamin with minerals Tabs tablet Take 1 tablet by mouth daily.   PRESERVISION AREDS 2 Caps Take 1 capsule by mouth 2 (two) times daily.   senna-docusate 8.6-50 MG tablet Commonly known as:  Senokot-S Take 1 tablet by mouth at bedtime.   THIAMINE HCL PO Take 1 tablet by mouth daily.      No Known Allergies Follow-up Information    Renette Butters, MD. Schedule an appointment as soon as possible for a visit in 1 week(s).   Specialty:  Orthopedic Surgery Why:  f/u in 1-2 weeks. Contact information: [address] [place] [address] [phone]        MD AT SNF Follow up.            The results of significant diagnostics from this hospitalization (including imaging, microbiology, ancillary and laboratory) are listed below for reference.    Significant Diagnostic Studies: Dg Hip Unilat W Or Wo Pelvis 2-3 Views Left  Result Date: 10/16/2017 CLINICAL DATA:  Post fall this morning, now with left thigh and hip pain. EXAM: DG HIP (WITH OR WITHOUT PELVIS) 2-3V LEFT COMPARISON:  None. FINDINGS: There is a minimally displaced fracture involving the posterolateral aspect of the proximal diaphysis of femur with extension to the femoral component of the left total hip replacement. No definite evidence of hardware failure or loosening. Adjacent  vascular calcifications. Limited visualization the pelvis and contralateral right hip demonstrates sequela prior right total hip replacement, incompletely evaluated. Degenerative change the lower lumbar spine is suspected though incompletely evaluated. IMPRESSION: Minimally displaced fracture involving the posterolateral aspect of the proximal diaphysis of the femur with extension to the femoral component of the left total hip replacement. Electronically Signed   By: Sandi Mariscal M.D.   On: 10/16/2017 08:28    Microbiology: No results found for this or any previous visit (from the past 240 hour(s)).   Labs: Basic Metabolic Panel: Recent Labs  Lab 10/16/17 0857 10/17/17 0341 10/18/17 1209  NA 140 141  --   K 3.8 4.0  --   CL 104 105  --   CO2 25 26  --   GLUCOSE 172* 161*  --   BUN 26* 26*  --   CREATININE 1.03 1.07  --   CALCIUM 8.7* 8.4*  --   MG  --   --  2.1   Liver Function Tests: No results for input(s): AST, ALT, ALKPHOS, BILITOT, PROT, ALBUMIN in the  last 168 hours. No results for input(s): LIPASE, AMYLASE in the last 168 hours. No results for input(s): AMMONIA in the last 168 hours. CBC: Recent Labs  Lab 10/16/17 0857 10/17/17 0341  WBC 6.6 7.9  NEUTROABS 5.1  --   HGB 12.9* 12.1*  HCT 39.2 36.6*  MCV 100.3* 98.7  PLT 124* 124*   Cardiac Enzymes: No results for input(s): CKTOTAL, CKMB, CKMBINDEX, TROPONINI in the last 168 hours. BNP: BNP (last 3 results) No results for input(s): BNP in the last 8760 hours.  ProBNP (last 3 results) No results for input(s): PROBNP in the last 8760 hours.  CBG: No results for input(s): GLUCAP in the last 168 hours.     Signed:  Irine Seal MD.  [facility] 10/18/2017, 2:36 PM

## 2017-10-19 DIAGNOSIS — I5030 Unspecified diastolic (congestive) heart failure: Secondary | ICD-10-CM | POA: Diagnosis not present

## 2017-10-19 DIAGNOSIS — I4891 Unspecified atrial fibrillation: Secondary | ICD-10-CM | POA: Diagnosis not present

## 2017-10-19 DIAGNOSIS — S72002A Fracture of unspecified part of neck of left femur, initial encounter for closed fracture: Secondary | ICD-10-CM | POA: Diagnosis not present

## 2017-10-19 DIAGNOSIS — N400 Enlarged prostate without lower urinary tract symptoms: Secondary | ICD-10-CM | POA: Diagnosis not present

## 2017-10-19 DIAGNOSIS — I10 Essential (primary) hypertension: Secondary | ICD-10-CM | POA: Diagnosis not present

## 2017-10-24 DIAGNOSIS — S31000D Unspecified open wound of lower back and pelvis without penetration into retroperitoneum, subsequent encounter: Secondary | ICD-10-CM | POA: Diagnosis not present

## 2017-10-24 DIAGNOSIS — L89620 Pressure ulcer of left heel, unstageable: Secondary | ICD-10-CM | POA: Diagnosis not present

## 2017-10-24 DIAGNOSIS — S31000A Unspecified open wound of lower back and pelvis without penetration into retroperitoneum, initial encounter: Secondary | ICD-10-CM | POA: Diagnosis not present

## 2017-10-27 DIAGNOSIS — S72355D Nondisplaced comminuted fracture of shaft of left femur, subsequent encounter for closed fracture with routine healing: Secondary | ICD-10-CM | POA: Diagnosis not present

## 2017-10-31 DIAGNOSIS — L89620 Pressure ulcer of left heel, unstageable: Secondary | ICD-10-CM | POA: Diagnosis not present

## 2017-10-31 DIAGNOSIS — S31000A Unspecified open wound of lower back and pelvis without penetration into retroperitoneum, initial encounter: Secondary | ICD-10-CM | POA: Diagnosis not present

## 2017-10-31 DIAGNOSIS — L89610 Pressure ulcer of right heel, unstageable: Secondary | ICD-10-CM | POA: Diagnosis not present

## 2017-10-31 DIAGNOSIS — S31000D Unspecified open wound of lower back and pelvis without penetration into retroperitoneum, subsequent encounter: Secondary | ICD-10-CM | POA: Diagnosis not present

## 2017-11-05 DIAGNOSIS — N390 Urinary tract infection, site not specified: Secondary | ICD-10-CM | POA: Diagnosis not present

## 2017-11-05 DIAGNOSIS — S72002A Fracture of unspecified part of neck of left femur, initial encounter for closed fracture: Secondary | ICD-10-CM | POA: Diagnosis not present

## 2017-11-05 DIAGNOSIS — J189 Pneumonia, unspecified organism: Secondary | ICD-10-CM | POA: Diagnosis not present

## 2017-11-07 DIAGNOSIS — L89610 Pressure ulcer of right heel, unstageable: Secondary | ICD-10-CM | POA: Diagnosis not present

## 2017-11-07 DIAGNOSIS — L89620 Pressure ulcer of left heel, unstageable: Secondary | ICD-10-CM | POA: Diagnosis not present

## 2017-11-07 DIAGNOSIS — S31000D Unspecified open wound of lower back and pelvis without penetration into retroperitoneum, subsequent encounter: Secondary | ICD-10-CM | POA: Diagnosis not present

## 2017-11-07 DIAGNOSIS — S31000A Unspecified open wound of lower back and pelvis without penetration into retroperitoneum, initial encounter: Secondary | ICD-10-CM | POA: Diagnosis not present

## 2017-11-14 DIAGNOSIS — L89610 Pressure ulcer of right heel, unstageable: Secondary | ICD-10-CM | POA: Diagnosis not present

## 2017-11-14 DIAGNOSIS — S31000A Unspecified open wound of lower back and pelvis without penetration into retroperitoneum, initial encounter: Secondary | ICD-10-CM | POA: Diagnosis not present

## 2017-11-14 DIAGNOSIS — S31000D Unspecified open wound of lower back and pelvis without penetration into retroperitoneum, subsequent encounter: Secondary | ICD-10-CM | POA: Diagnosis not present

## 2017-11-14 DIAGNOSIS — L89620 Pressure ulcer of left heel, unstageable: Secondary | ICD-10-CM | POA: Diagnosis not present

## 2017-11-16 ENCOUNTER — Other Ambulatory Visit: Payer: Self-pay | Admitting: *Deleted

## 2017-11-16 NOTE — Patient Outreach (Signed)
[facility]) Care Management  11/16/2017  Duane Spears 1920/11/30 [id number]   Attended discharge planning meeting at [place]. PT, Nursing, D/C Planner and Admissions representatives present from the facility. Nursing stated patient had ben diagnosed with pneumonia and had started antibiotics. Nursing stated this had caused patient to be slightly confused. PT stated patient was progressing slowly but was going back to orthopedic appointment tomorrow. They are hoping they will be able to increase patient's weight bearing status. Discharge planner stated patient's son involved and concerned about patient retuning to home.    Attempted to to visit patient at the bedside but patient was in the therapy room. Patient was actively participating in therapy. Saw patient attempting transfers in therapy with a sliding board.  Did not disturb patient in therapy.   Co-visit with [facility] UM team member [place].   Plan to see patient next week.   Rutherford Limerick RN, BSN [place] Acute Care Coordinator [phone]) Business Mobile [phone]) Toll free office

## 2017-11-17 DIAGNOSIS — S72355D Nondisplaced comminuted fracture of shaft of left femur, subsequent encounter for closed fracture with routine healing: Secondary | ICD-10-CM | POA: Diagnosis not present

## 2017-11-21 DIAGNOSIS — S31000D Unspecified open wound of lower back and pelvis without penetration into retroperitoneum, subsequent encounter: Secondary | ICD-10-CM | POA: Diagnosis not present

## 2017-11-21 DIAGNOSIS — S31000A Unspecified open wound of lower back and pelvis without penetration into retroperitoneum, initial encounter: Secondary | ICD-10-CM | POA: Diagnosis not present

## 2017-11-21 DIAGNOSIS — L89620 Pressure ulcer of left heel, unstageable: Secondary | ICD-10-CM | POA: Diagnosis not present

## 2017-11-21 DIAGNOSIS — L89610 Pressure ulcer of right heel, unstageable: Secondary | ICD-10-CM | POA: Diagnosis not present

## 2017-11-25 DIAGNOSIS — I4891 Unspecified atrial fibrillation: Secondary | ICD-10-CM | POA: Diagnosis not present

## 2017-11-25 DIAGNOSIS — E119 Type 2 diabetes mellitus without complications: Secondary | ICD-10-CM | POA: Diagnosis not present

## 2017-11-25 DIAGNOSIS — J189 Pneumonia, unspecified organism: Secondary | ICD-10-CM | POA: Diagnosis not present

## 2017-11-25 DIAGNOSIS — I5030 Unspecified diastolic (congestive) heart failure: Secondary | ICD-10-CM | POA: Diagnosis not present

## 2017-11-25 DIAGNOSIS — S72002A Fracture of unspecified part of neck of left femur, initial encounter for closed fracture: Secondary | ICD-10-CM | POA: Diagnosis not present

## 2017-11-25 DIAGNOSIS — N400 Enlarged prostate without lower urinary tract symptoms: Secondary | ICD-10-CM | POA: Diagnosis not present

## 2017-11-28 DIAGNOSIS — S31000A Unspecified open wound of lower back and pelvis without penetration into retroperitoneum, initial encounter: Secondary | ICD-10-CM | POA: Diagnosis not present

## 2017-11-28 DIAGNOSIS — L89610 Pressure ulcer of right heel, unstageable: Secondary | ICD-10-CM | POA: Diagnosis not present

## 2017-11-28 DIAGNOSIS — S31000D Unspecified open wound of lower back and pelvis without penetration into retroperitoneum, subsequent encounter: Secondary | ICD-10-CM | POA: Diagnosis not present

## 2017-11-28 DIAGNOSIS — L89620 Pressure ulcer of left heel, unstageable: Secondary | ICD-10-CM | POA: Diagnosis not present

## 2017-11-30 ENCOUNTER — Other Ambulatory Visit: Payer: Self-pay | Admitting: *Deleted

## 2017-11-30 NOTE — Patient Outreach (Signed)
[facility]) Care Management  11/30/2017  RONDLE LOHSE 12-07-1920 [id number]   Attended discharge planning meeting at [facility] at [facility] UM Members Manuela Schwartz and Jari Pigg present. Patient discussed and discharge plan noted as patient plans to remain at [facility] under custodial care.  No [place] Management needs noted at this time.  Rutherford Limerick RN, BSN [place] Acute Care Coordinator [phone]) Business Mobile [phone]) Toll free office

## 2017-12-04 DIAGNOSIS — S31000D Unspecified open wound of lower back and pelvis without penetration into retroperitoneum, subsequent encounter: Secondary | ICD-10-CM | POA: Diagnosis not present

## 2017-12-04 DIAGNOSIS — L89610 Pressure ulcer of right heel, unstageable: Secondary | ICD-10-CM | POA: Diagnosis not present

## 2017-12-04 DIAGNOSIS — L89620 Pressure ulcer of left heel, unstageable: Secondary | ICD-10-CM | POA: Diagnosis not present

## 2017-12-04 DIAGNOSIS — S31000A Unspecified open wound of lower back and pelvis without penetration into retroperitoneum, initial encounter: Secondary | ICD-10-CM | POA: Diagnosis not present

## 2017-12-15 DIAGNOSIS — S72355D Nondisplaced comminuted fracture of shaft of left femur, subsequent encounter for closed fracture with routine healing: Secondary | ICD-10-CM | POA: Diagnosis not present

## 2017-12-26 DIAGNOSIS — S31000A Unspecified open wound of lower back and pelvis without penetration into retroperitoneum, initial encounter: Secondary | ICD-10-CM | POA: Diagnosis not present

## 2017-12-26 DIAGNOSIS — L89610 Pressure ulcer of right heel, unstageable: Secondary | ICD-10-CM | POA: Diagnosis not present

## 2017-12-26 DIAGNOSIS — S31000D Unspecified open wound of lower back and pelvis without penetration into retroperitoneum, subsequent encounter: Secondary | ICD-10-CM | POA: Diagnosis not present

## 2017-12-26 DIAGNOSIS — L89620 Pressure ulcer of left heel, unstageable: Secondary | ICD-10-CM | POA: Diagnosis not present

## 2017-12-31 DIAGNOSIS — E119 Type 2 diabetes mellitus without complications: Secondary | ICD-10-CM | POA: Diagnosis not present

## 2018-01-16 DIAGNOSIS — L89610 Pressure ulcer of right heel, unstageable: Secondary | ICD-10-CM | POA: Diagnosis not present

## 2018-01-16 DIAGNOSIS — S31000A Unspecified open wound of lower back and pelvis without penetration into retroperitoneum, initial encounter: Secondary | ICD-10-CM | POA: Diagnosis not present

## 2018-01-16 DIAGNOSIS — S31000D Unspecified open wound of lower back and pelvis without penetration into retroperitoneum, subsequent encounter: Secondary | ICD-10-CM | POA: Diagnosis not present

## 2018-01-16 DIAGNOSIS — L89620 Pressure ulcer of left heel, unstageable: Secondary | ICD-10-CM | POA: Diagnosis not present

## 2018-01-23 DIAGNOSIS — S31000D Unspecified open wound of lower back and pelvis without penetration into retroperitoneum, subsequent encounter: Secondary | ICD-10-CM | POA: Diagnosis not present

## 2018-01-23 DIAGNOSIS — L89620 Pressure ulcer of left heel, unstageable: Secondary | ICD-10-CM | POA: Diagnosis not present

## 2018-01-23 DIAGNOSIS — L89610 Pressure ulcer of right heel, unstageable: Secondary | ICD-10-CM | POA: Diagnosis not present

## 2018-01-23 DIAGNOSIS — S31000A Unspecified open wound of lower back and pelvis without penetration into retroperitoneum, initial encounter: Secondary | ICD-10-CM | POA: Diagnosis not present

## 2018-01-26 DIAGNOSIS — L89613 Pressure ulcer of right heel, stage 3: Secondary | ICD-10-CM | POA: Diagnosis not present

## 2018-01-26 DIAGNOSIS — R262 Difficulty in walking, not elsewhere classified: Secondary | ICD-10-CM | POA: Diagnosis not present

## 2018-01-29 DIAGNOSIS — S72042D Displaced fracture of base of neck of left femur, subsequent encounter for closed fracture with routine healing: Secondary | ICD-10-CM | POA: Diagnosis not present

## 2018-01-29 DIAGNOSIS — L89613 Pressure ulcer of right heel, stage 3: Secondary | ICD-10-CM | POA: Diagnosis not present

## 2018-01-29 DIAGNOSIS — I110 Hypertensive heart disease with heart failure: Secondary | ICD-10-CM | POA: Diagnosis not present

## 2018-01-29 DIAGNOSIS — L89153 Pressure ulcer of sacral region, stage 3: Secondary | ICD-10-CM | POA: Diagnosis not present

## 2018-01-29 DIAGNOSIS — M9702XD Periprosthetic fracture around internal prosthetic left hip joint, subsequent encounter: Secondary | ICD-10-CM | POA: Diagnosis not present

## 2018-01-30 DIAGNOSIS — L89620 Pressure ulcer of left heel, unstageable: Secondary | ICD-10-CM | POA: Diagnosis not present

## 2018-01-30 DIAGNOSIS — S31000A Unspecified open wound of lower back and pelvis without penetration into retroperitoneum, initial encounter: Secondary | ICD-10-CM | POA: Diagnosis not present

## 2018-01-30 DIAGNOSIS — S31000D Unspecified open wound of lower back and pelvis without penetration into retroperitoneum, subsequent encounter: Secondary | ICD-10-CM | POA: Diagnosis not present

## 2018-01-30 DIAGNOSIS — L89610 Pressure ulcer of right heel, unstageable: Secondary | ICD-10-CM | POA: Diagnosis not present

## 2018-01-31 DIAGNOSIS — L89153 Pressure ulcer of sacral region, stage 3: Secondary | ICD-10-CM | POA: Diagnosis not present

## 2018-01-31 DIAGNOSIS — L89613 Pressure ulcer of right heel, stage 3: Secondary | ICD-10-CM | POA: Diagnosis not present

## 2018-02-01 DIAGNOSIS — E039 Hypothyroidism, unspecified: Secondary | ICD-10-CM | POA: Diagnosis not present

## 2018-02-01 DIAGNOSIS — L89613 Pressure ulcer of right heel, stage 3: Secondary | ICD-10-CM | POA: Diagnosis not present

## 2018-02-01 DIAGNOSIS — L89153 Pressure ulcer of sacral region, stage 3: Secondary | ICD-10-CM | POA: Diagnosis not present

## 2018-02-01 DIAGNOSIS — Z79899 Other long term (current) drug therapy: Secondary | ICD-10-CM | POA: Diagnosis not present

## 2018-02-06 DIAGNOSIS — I110 Hypertensive heart disease with heart failure: Secondary | ICD-10-CM | POA: Diagnosis not present

## 2018-02-06 DIAGNOSIS — L89613 Pressure ulcer of right heel, stage 3: Secondary | ICD-10-CM | POA: Diagnosis not present

## 2018-02-06 DIAGNOSIS — S72042D Displaced fracture of base of neck of left femur, subsequent encounter for closed fracture with routine healing: Secondary | ICD-10-CM | POA: Diagnosis not present

## 2018-02-06 DIAGNOSIS — L89153 Pressure ulcer of sacral region, stage 3: Secondary | ICD-10-CM | POA: Diagnosis not present

## 2018-02-06 DIAGNOSIS — M9702XD Periprosthetic fracture around internal prosthetic left hip joint, subsequent encounter: Secondary | ICD-10-CM | POA: Diagnosis not present

## 2018-02-06 DIAGNOSIS — S31000D Unspecified open wound of lower back and pelvis without penetration into retroperitoneum, subsequent encounter: Secondary | ICD-10-CM | POA: Diagnosis not present

## 2018-02-10 DIAGNOSIS — I509 Heart failure, unspecified: Secondary | ICD-10-CM | POA: Diagnosis not present

## 2018-02-12 DIAGNOSIS — L89153 Pressure ulcer of sacral region, stage 3: Secondary | ICD-10-CM | POA: Diagnosis not present

## 2018-02-12 DIAGNOSIS — L89613 Pressure ulcer of right heel, stage 3: Secondary | ICD-10-CM | POA: Diagnosis not present

## 2018-02-12 DIAGNOSIS — I110 Hypertensive heart disease with heart failure: Secondary | ICD-10-CM | POA: Diagnosis not present

## 2018-02-12 DIAGNOSIS — S72042D Displaced fracture of base of neck of left femur, subsequent encounter for closed fracture with routine healing: Secondary | ICD-10-CM | POA: Diagnosis not present

## 2018-02-12 DIAGNOSIS — M9702XD Periprosthetic fracture around internal prosthetic left hip joint, subsequent encounter: Secondary | ICD-10-CM | POA: Diagnosis not present

## 2018-02-13 DIAGNOSIS — S31000D Unspecified open wound of lower back and pelvis without penetration into retroperitoneum, subsequent encounter: Secondary | ICD-10-CM | POA: Diagnosis not present

## 2018-02-20 DIAGNOSIS — S31000D Unspecified open wound of lower back and pelvis without penetration into retroperitoneum, subsequent encounter: Secondary | ICD-10-CM | POA: Diagnosis not present

## 2018-02-22 DIAGNOSIS — Z79899 Other long term (current) drug therapy: Secondary | ICD-10-CM | POA: Diagnosis not present

## 2018-02-22 DIAGNOSIS — R0602 Shortness of breath: Secondary | ICD-10-CM | POA: Diagnosis not present

## 2018-02-22 DIAGNOSIS — D649 Anemia, unspecified: Secondary | ICD-10-CM | POA: Diagnosis not present

## 2018-02-22 DIAGNOSIS — R609 Edema, unspecified: Secondary | ICD-10-CM | POA: Diagnosis not present

## 2018-02-27 DIAGNOSIS — S31000D Unspecified open wound of lower back and pelvis without penetration into retroperitoneum, subsequent encounter: Secondary | ICD-10-CM | POA: Diagnosis not present

## 2018-03-13 DIAGNOSIS — L8990 Pressure ulcer of unspecified site, unspecified stage: Secondary | ICD-10-CM | POA: Diagnosis not present

## 2018-03-19 DIAGNOSIS — E119 Type 2 diabetes mellitus without complications: Secondary | ICD-10-CM | POA: Diagnosis not present

## 2018-03-19 DIAGNOSIS — Z794 Long term (current) use of insulin: Secondary | ICD-10-CM | POA: Diagnosis not present

## 2018-03-20 DIAGNOSIS — L8990 Pressure ulcer of unspecified site, unspecified stage: Secondary | ICD-10-CM | POA: Diagnosis not present

## 2018-03-21 DIAGNOSIS — Z1211 Encounter for screening for malignant neoplasm of colon: Secondary | ICD-10-CM | POA: Diagnosis not present

## 2018-03-22 DIAGNOSIS — R05 Cough: Secondary | ICD-10-CM | POA: Diagnosis not present

## 2018-03-22 DIAGNOSIS — I509 Heart failure, unspecified: Secondary | ICD-10-CM | POA: Diagnosis not present

## 2018-03-23 DIAGNOSIS — N390 Urinary tract infection, site not specified: Secondary | ICD-10-CM | POA: Diagnosis not present

## 2018-03-23 DIAGNOSIS — R319 Hematuria, unspecified: Secondary | ICD-10-CM | POA: Diagnosis not present

## 2018-03-26 DIAGNOSIS — J189 Pneumonia, unspecified organism: Secondary | ICD-10-CM | POA: Diagnosis not present

## 2018-03-27 DIAGNOSIS — L8990 Pressure ulcer of unspecified site, unspecified stage: Secondary | ICD-10-CM | POA: Diagnosis not present

## 2018-04-02 DIAGNOSIS — D649 Anemia, unspecified: Secondary | ICD-10-CM | POA: Diagnosis not present

## 2018-04-02 DIAGNOSIS — Z79899 Other long term (current) drug therapy: Secondary | ICD-10-CM | POA: Diagnosis not present

## 2018-04-03 DIAGNOSIS — L8990 Pressure ulcer of unspecified site, unspecified stage: Secondary | ICD-10-CM | POA: Diagnosis not present

## 2018-04-12 DIAGNOSIS — N390 Urinary tract infection, site not specified: Secondary | ICD-10-CM | POA: Diagnosis not present

## 2018-04-12 DIAGNOSIS — Z79899 Other long term (current) drug therapy: Secondary | ICD-10-CM | POA: Diagnosis not present

## 2018-04-12 DIAGNOSIS — E039 Hypothyroidism, unspecified: Secondary | ICD-10-CM | POA: Diagnosis not present

## 2018-04-17 DIAGNOSIS — L89890 Pressure ulcer of other site, unstageable: Secondary | ICD-10-CM | POA: Diagnosis not present

## 2018-04-25 DIAGNOSIS — E1151 Type 2 diabetes mellitus with diabetic peripheral angiopathy without gangrene: Secondary | ICD-10-CM | POA: Diagnosis not present

## 2018-04-25 DIAGNOSIS — J309 Allergic rhinitis, unspecified: Secondary | ICD-10-CM | POA: Diagnosis not present

## 2018-04-30 DIAGNOSIS — E1151 Type 2 diabetes mellitus with diabetic peripheral angiopathy without gangrene: Secondary | ICD-10-CM | POA: Diagnosis not present

## 2018-04-30 DIAGNOSIS — H9193 Unspecified hearing loss, bilateral: Secondary | ICD-10-CM | POA: Diagnosis not present

## 2018-04-30 DIAGNOSIS — I4891 Unspecified atrial fibrillation: Secondary | ICD-10-CM | POA: Diagnosis not present

## 2018-04-30 DIAGNOSIS — I110 Hypertensive heart disease with heart failure: Secondary | ICD-10-CM | POA: Diagnosis not present

## 2018-04-30 DIAGNOSIS — I5032 Chronic diastolic (congestive) heart failure: Secondary | ICD-10-CM | POA: Diagnosis not present

## 2018-05-01 DIAGNOSIS — L89890 Pressure ulcer of other site, unstageable: Secondary | ICD-10-CM | POA: Diagnosis not present

## 2018-05-15 DIAGNOSIS — I4891 Unspecified atrial fibrillation: Secondary | ICD-10-CM | POA: Diagnosis not present

## 2018-05-15 DIAGNOSIS — H9193 Unspecified hearing loss, bilateral: Secondary | ICD-10-CM | POA: Diagnosis not present

## 2018-05-15 DIAGNOSIS — E1151 Type 2 diabetes mellitus with diabetic peripheral angiopathy without gangrene: Secondary | ICD-10-CM | POA: Diagnosis not present

## 2018-05-15 DIAGNOSIS — L89890 Pressure ulcer of other site, unstageable: Secondary | ICD-10-CM | POA: Diagnosis not present

## 2018-05-15 DIAGNOSIS — I5032 Chronic diastolic (congestive) heart failure: Secondary | ICD-10-CM | POA: Diagnosis not present

## 2018-05-15 DIAGNOSIS — I110 Hypertensive heart disease with heart failure: Secondary | ICD-10-CM | POA: Diagnosis not present

## 2018-05-29 DIAGNOSIS — L89890 Pressure ulcer of other site, unstageable: Secondary | ICD-10-CM | POA: Diagnosis not present

## 2018-06-12 DIAGNOSIS — L89890 Pressure ulcer of other site, unstageable: Secondary | ICD-10-CM | POA: Diagnosis not present

## 2018-10-24 DIAGNOSIS — E1140 Type 2 diabetes mellitus with diabetic neuropathy, unspecified: Secondary | ICD-10-CM | POA: Diagnosis not present

## 2018-10-24 DIAGNOSIS — E1122 Type 2 diabetes mellitus with diabetic chronic kidney disease: Secondary | ICD-10-CM | POA: Diagnosis not present

## 2018-10-25 DIAGNOSIS — E039 Hypothyroidism, unspecified: Secondary | ICD-10-CM | POA: Diagnosis not present

## 2018-10-25 DIAGNOSIS — E119 Type 2 diabetes mellitus without complications: Secondary | ICD-10-CM | POA: Diagnosis not present

## 2018-10-25 DIAGNOSIS — D519 Vitamin B12 deficiency anemia, unspecified: Secondary | ICD-10-CM | POA: Diagnosis not present

## 2018-10-25 DIAGNOSIS — D649 Anemia, unspecified: Secondary | ICD-10-CM | POA: Diagnosis not present

## 2018-10-25 DIAGNOSIS — Z79899 Other long term (current) drug therapy: Secondary | ICD-10-CM | POA: Diagnosis not present

## 2018-10-29 DIAGNOSIS — I130 Hypertensive heart and chronic kidney disease with heart failure and stage 1 through stage 4 chronic kidney disease, or unspecified chronic kidney disease: Secondary | ICD-10-CM | POA: Diagnosis not present

## 2018-10-29 DIAGNOSIS — R1310 Dysphagia, unspecified: Secondary | ICD-10-CM | POA: Diagnosis not present

## 2018-10-29 DIAGNOSIS — N183 Chronic kidney disease, stage 3 (moderate): Secondary | ICD-10-CM | POA: Diagnosis not present

## 2018-10-29 DIAGNOSIS — H9193 Unspecified hearing loss, bilateral: Secondary | ICD-10-CM | POA: Diagnosis not present

## 2018-10-29 DIAGNOSIS — K5900 Constipation, unspecified: Secondary | ICD-10-CM | POA: Diagnosis not present

## 2018-10-29 DIAGNOSIS — Z9181 History of falling: Secondary | ICD-10-CM | POA: Diagnosis not present

## 2018-10-29 DIAGNOSIS — Z79891 Long term (current) use of opiate analgesic: Secondary | ICD-10-CM | POA: Diagnosis not present

## 2018-10-29 DIAGNOSIS — I5030 Unspecified diastolic (congestive) heart failure: Secondary | ICD-10-CM | POA: Diagnosis not present

## 2018-10-29 DIAGNOSIS — I4891 Unspecified atrial fibrillation: Secondary | ICD-10-CM | POA: Diagnosis not present

## 2018-10-29 DIAGNOSIS — N400 Enlarged prostate without lower urinary tract symptoms: Secondary | ICD-10-CM | POA: Diagnosis not present

## 2018-10-29 DIAGNOSIS — E1122 Type 2 diabetes mellitus with diabetic chronic kidney disease: Secondary | ICD-10-CM | POA: Diagnosis not present

## 2018-10-29 DIAGNOSIS — K219 Gastro-esophageal reflux disease without esophagitis: Secondary | ICD-10-CM | POA: Diagnosis not present

## 2018-10-29 DIAGNOSIS — R41841 Cognitive communication deficit: Secondary | ICD-10-CM | POA: Diagnosis not present

## 2018-10-29 DIAGNOSIS — E1151 Type 2 diabetes mellitus with diabetic peripheral angiopathy without gangrene: Secondary | ICD-10-CM | POA: Diagnosis not present

## 2018-10-29 DIAGNOSIS — Z794 Long term (current) use of insulin: Secondary | ICD-10-CM | POA: Diagnosis not present

## 2018-10-29 DIAGNOSIS — J309 Allergic rhinitis, unspecified: Secondary | ICD-10-CM | POA: Diagnosis not present

## 2018-11-14 DIAGNOSIS — E1151 Type 2 diabetes mellitus with diabetic peripheral angiopathy without gangrene: Secondary | ICD-10-CM | POA: Diagnosis not present

## 2018-11-14 DIAGNOSIS — R1310 Dysphagia, unspecified: Secondary | ICD-10-CM | POA: Diagnosis not present

## 2018-11-14 DIAGNOSIS — I130 Hypertensive heart and chronic kidney disease with heart failure and stage 1 through stage 4 chronic kidney disease, or unspecified chronic kidney disease: Secondary | ICD-10-CM | POA: Diagnosis not present

## 2018-11-14 DIAGNOSIS — R41841 Cognitive communication deficit: Secondary | ICD-10-CM | POA: Diagnosis not present

## 2018-11-14 DIAGNOSIS — H9193 Unspecified hearing loss, bilateral: Secondary | ICD-10-CM | POA: Diagnosis not present

## 2018-11-14 DIAGNOSIS — E1122 Type 2 diabetes mellitus with diabetic chronic kidney disease: Secondary | ICD-10-CM | POA: Diagnosis not present

## 2018-11-14 DIAGNOSIS — I5030 Unspecified diastolic (congestive) heart failure: Secondary | ICD-10-CM | POA: Diagnosis not present

## 2018-11-14 DIAGNOSIS — K219 Gastro-esophageal reflux disease without esophagitis: Secondary | ICD-10-CM | POA: Diagnosis not present

## 2018-11-14 DIAGNOSIS — K5900 Constipation, unspecified: Secondary | ICD-10-CM | POA: Diagnosis not present

## 2018-11-14 DIAGNOSIS — I4891 Unspecified atrial fibrillation: Secondary | ICD-10-CM | POA: Diagnosis not present

## 2018-11-14 DIAGNOSIS — N400 Enlarged prostate without lower urinary tract symptoms: Secondary | ICD-10-CM | POA: Diagnosis not present

## 2018-11-14 DIAGNOSIS — Z794 Long term (current) use of insulin: Secondary | ICD-10-CM | POA: Diagnosis not present

## 2018-11-14 DIAGNOSIS — N183 Chronic kidney disease, stage 3 (moderate): Secondary | ICD-10-CM | POA: Diagnosis not present

## 2018-11-14 DIAGNOSIS — Z79891 Long term (current) use of opiate analgesic: Secondary | ICD-10-CM | POA: Diagnosis not present

## 2018-11-14 DIAGNOSIS — J309 Allergic rhinitis, unspecified: Secondary | ICD-10-CM | POA: Diagnosis not present

## 2018-11-14 DIAGNOSIS — Z9181 History of falling: Secondary | ICD-10-CM | POA: Diagnosis not present

## 2018-11-19 DIAGNOSIS — Z794 Long term (current) use of insulin: Secondary | ICD-10-CM | POA: Diagnosis not present

## 2018-11-19 DIAGNOSIS — Z79891 Long term (current) use of opiate analgesic: Secondary | ICD-10-CM | POA: Diagnosis not present

## 2018-11-19 DIAGNOSIS — N183 Chronic kidney disease, stage 3 (moderate): Secondary | ICD-10-CM | POA: Diagnosis not present

## 2018-11-19 DIAGNOSIS — R41841 Cognitive communication deficit: Secondary | ICD-10-CM | POA: Diagnosis not present

## 2018-11-19 DIAGNOSIS — H9193 Unspecified hearing loss, bilateral: Secondary | ICD-10-CM | POA: Diagnosis not present

## 2018-11-19 DIAGNOSIS — I4891 Unspecified atrial fibrillation: Secondary | ICD-10-CM | POA: Diagnosis not present

## 2018-11-19 DIAGNOSIS — Z9181 History of falling: Secondary | ICD-10-CM | POA: Diagnosis not present

## 2018-11-19 DIAGNOSIS — N400 Enlarged prostate without lower urinary tract symptoms: Secondary | ICD-10-CM | POA: Diagnosis not present

## 2018-11-19 DIAGNOSIS — I5030 Unspecified diastolic (congestive) heart failure: Secondary | ICD-10-CM | POA: Diagnosis not present

## 2018-11-19 DIAGNOSIS — K5900 Constipation, unspecified: Secondary | ICD-10-CM | POA: Diagnosis not present

## 2018-11-19 DIAGNOSIS — R1310 Dysphagia, unspecified: Secondary | ICD-10-CM | POA: Diagnosis not present

## 2018-11-19 DIAGNOSIS — E1151 Type 2 diabetes mellitus with diabetic peripheral angiopathy without gangrene: Secondary | ICD-10-CM | POA: Diagnosis not present

## 2018-11-19 DIAGNOSIS — I130 Hypertensive heart and chronic kidney disease with heart failure and stage 1 through stage 4 chronic kidney disease, or unspecified chronic kidney disease: Secondary | ICD-10-CM | POA: Diagnosis not present

## 2018-11-19 DIAGNOSIS — J309 Allergic rhinitis, unspecified: Secondary | ICD-10-CM | POA: Diagnosis not present

## 2018-11-19 DIAGNOSIS — K219 Gastro-esophageal reflux disease without esophagitis: Secondary | ICD-10-CM | POA: Diagnosis not present

## 2018-11-19 DIAGNOSIS — E1122 Type 2 diabetes mellitus with diabetic chronic kidney disease: Secondary | ICD-10-CM | POA: Diagnosis not present

## 2018-12-04 DIAGNOSIS — R1319 Other dysphagia: Secondary | ICD-10-CM | POA: Diagnosis not present

## 2018-12-04 DIAGNOSIS — R489 Unspecified symbolic dysfunctions: Secondary | ICD-10-CM | POA: Diagnosis not present

## 2018-12-04 DIAGNOSIS — R05 Cough: Secondary | ICD-10-CM | POA: Diagnosis not present

## 2018-12-04 DIAGNOSIS — J189 Pneumonia, unspecified organism: Secondary | ICD-10-CM | POA: Diagnosis not present

## 2018-12-04 DIAGNOSIS — R0989 Other specified symptoms and signs involving the circulatory and respiratory systems: Secondary | ICD-10-CM | POA: Diagnosis not present

## 2018-12-06 DIAGNOSIS — Z79891 Long term (current) use of opiate analgesic: Secondary | ICD-10-CM | POA: Diagnosis not present

## 2018-12-06 DIAGNOSIS — E1122 Type 2 diabetes mellitus with diabetic chronic kidney disease: Secondary | ICD-10-CM | POA: Diagnosis not present

## 2018-12-06 DIAGNOSIS — H9193 Unspecified hearing loss, bilateral: Secondary | ICD-10-CM | POA: Diagnosis not present

## 2018-12-06 DIAGNOSIS — Z794 Long term (current) use of insulin: Secondary | ICD-10-CM | POA: Diagnosis not present

## 2018-12-06 DIAGNOSIS — R41841 Cognitive communication deficit: Secondary | ICD-10-CM | POA: Diagnosis not present

## 2018-12-06 DIAGNOSIS — E1151 Type 2 diabetes mellitus with diabetic peripheral angiopathy without gangrene: Secondary | ICD-10-CM | POA: Diagnosis not present

## 2018-12-06 DIAGNOSIS — Z9181 History of falling: Secondary | ICD-10-CM | POA: Diagnosis not present

## 2018-12-06 DIAGNOSIS — J309 Allergic rhinitis, unspecified: Secondary | ICD-10-CM | POA: Diagnosis not present

## 2018-12-06 DIAGNOSIS — I5030 Unspecified diastolic (congestive) heart failure: Secondary | ICD-10-CM | POA: Diagnosis not present

## 2018-12-06 DIAGNOSIS — K219 Gastro-esophageal reflux disease without esophagitis: Secondary | ICD-10-CM | POA: Diagnosis not present

## 2018-12-06 DIAGNOSIS — R1310 Dysphagia, unspecified: Secondary | ICD-10-CM | POA: Diagnosis not present

## 2018-12-06 DIAGNOSIS — N183 Chronic kidney disease, stage 3 (moderate): Secondary | ICD-10-CM | POA: Diagnosis not present

## 2018-12-06 DIAGNOSIS — K5900 Constipation, unspecified: Secondary | ICD-10-CM | POA: Diagnosis not present

## 2018-12-06 DIAGNOSIS — I4891 Unspecified atrial fibrillation: Secondary | ICD-10-CM | POA: Diagnosis not present

## 2018-12-06 DIAGNOSIS — N400 Enlarged prostate without lower urinary tract symptoms: Secondary | ICD-10-CM | POA: Diagnosis not present

## 2018-12-06 DIAGNOSIS — I130 Hypertensive heart and chronic kidney disease with heart failure and stage 1 through stage 4 chronic kidney disease, or unspecified chronic kidney disease: Secondary | ICD-10-CM | POA: Diagnosis not present

## 2018-12-13 DIAGNOSIS — K5900 Constipation, unspecified: Secondary | ICD-10-CM | POA: Diagnosis not present

## 2018-12-13 DIAGNOSIS — R41841 Cognitive communication deficit: Secondary | ICD-10-CM | POA: Diagnosis not present

## 2018-12-13 DIAGNOSIS — H9193 Unspecified hearing loss, bilateral: Secondary | ICD-10-CM | POA: Diagnosis not present

## 2018-12-13 DIAGNOSIS — I4891 Unspecified atrial fibrillation: Secondary | ICD-10-CM | POA: Diagnosis not present

## 2018-12-13 DIAGNOSIS — R1310 Dysphagia, unspecified: Secondary | ICD-10-CM | POA: Diagnosis not present

## 2018-12-13 DIAGNOSIS — Z794 Long term (current) use of insulin: Secondary | ICD-10-CM | POA: Diagnosis not present

## 2018-12-13 DIAGNOSIS — Z79891 Long term (current) use of opiate analgesic: Secondary | ICD-10-CM | POA: Diagnosis not present

## 2018-12-13 DIAGNOSIS — J309 Allergic rhinitis, unspecified: Secondary | ICD-10-CM | POA: Diagnosis not present

## 2018-12-13 DIAGNOSIS — I5030 Unspecified diastolic (congestive) heart failure: Secondary | ICD-10-CM | POA: Diagnosis not present

## 2018-12-13 DIAGNOSIS — N400 Enlarged prostate without lower urinary tract symptoms: Secondary | ICD-10-CM | POA: Diagnosis not present

## 2018-12-13 DIAGNOSIS — E1122 Type 2 diabetes mellitus with diabetic chronic kidney disease: Secondary | ICD-10-CM | POA: Diagnosis not present

## 2018-12-13 DIAGNOSIS — Z20828 Contact with and (suspected) exposure to other viral communicable diseases: Secondary | ICD-10-CM | POA: Diagnosis not present

## 2018-12-13 DIAGNOSIS — Z9181 History of falling: Secondary | ICD-10-CM | POA: Diagnosis not present

## 2018-12-13 DIAGNOSIS — I130 Hypertensive heart and chronic kidney disease with heart failure and stage 1 through stage 4 chronic kidney disease, or unspecified chronic kidney disease: Secondary | ICD-10-CM | POA: Diagnosis not present

## 2018-12-13 DIAGNOSIS — E1151 Type 2 diabetes mellitus with diabetic peripheral angiopathy without gangrene: Secondary | ICD-10-CM | POA: Diagnosis not present

## 2018-12-13 DIAGNOSIS — N183 Chronic kidney disease, stage 3 unspecified: Secondary | ICD-10-CM | POA: Diagnosis not present

## 2018-12-13 DIAGNOSIS — K219 Gastro-esophageal reflux disease without esophagitis: Secondary | ICD-10-CM | POA: Diagnosis not present

## 2018-12-19 DIAGNOSIS — Z20828 Contact with and (suspected) exposure to other viral communicable diseases: Secondary | ICD-10-CM | POA: Diagnosis not present

## 2018-12-26 DIAGNOSIS — Z20828 Contact with and (suspected) exposure to other viral communicable diseases: Secondary | ICD-10-CM | POA: Diagnosis not present

## 2019-01-09 DIAGNOSIS — N183 Chronic kidney disease, stage 3 unspecified: Secondary | ICD-10-CM | POA: Diagnosis not present

## 2019-01-10 DIAGNOSIS — Z79899 Other long term (current) drug therapy: Secondary | ICD-10-CM | POA: Diagnosis not present

## 2019-04-17 DIAGNOSIS — I509 Heart failure, unspecified: Secondary | ICD-10-CM | POA: Diagnosis not present

## 2019-04-18 DIAGNOSIS — N390 Urinary tract infection, site not specified: Secondary | ICD-10-CM | POA: Diagnosis not present

## 2019-04-18 DIAGNOSIS — E119 Type 2 diabetes mellitus without complications: Secondary | ICD-10-CM | POA: Diagnosis not present

## 2019-04-18 DIAGNOSIS — Z79899 Other long term (current) drug therapy: Secondary | ICD-10-CM | POA: Diagnosis not present

## 2019-04-18 DIAGNOSIS — D649 Anemia, unspecified: Secondary | ICD-10-CM | POA: Diagnosis not present

## 2019-05-09 DIAGNOSIS — Z1211 Encounter for screening for malignant neoplasm of colon: Secondary | ICD-10-CM | POA: Diagnosis not present

## 2019-05-20 DIAGNOSIS — Z79899 Other long term (current) drug therapy: Secondary | ICD-10-CM | POA: Diagnosis not present

## 2019-05-20 DIAGNOSIS — D649 Anemia, unspecified: Secondary | ICD-10-CM | POA: Diagnosis not present

## 2019-10-14 DIAGNOSIS — E119 Type 2 diabetes mellitus without complications: Secondary | ICD-10-CM | POA: Diagnosis not present

## 2019-10-14 DIAGNOSIS — Z79899 Other long term (current) drug therapy: Secondary | ICD-10-CM | POA: Diagnosis not present

## 2019-10-14 DIAGNOSIS — N390 Urinary tract infection, site not specified: Secondary | ICD-10-CM | POA: Diagnosis not present

## 2019-10-14 DIAGNOSIS — D649 Anemia, unspecified: Secondary | ICD-10-CM | POA: Diagnosis not present

## 2019-10-23 ENCOUNTER — Emergency Department (HOSPITAL_COMMUNITY)
Admission: EM | Admit: 2019-10-23 | Discharge: 2019-10-24 | Disposition: A | Discharge status: Home / Self Care | Payer: PPO | Attending: Emergency Medicine | Admitting: Emergency Medicine

## 2019-10-23 ENCOUNTER — Emergency Department (HOSPITAL_COMMUNITY): Payer: PPO

## 2019-10-23 ENCOUNTER — Other Ambulatory Visit: Payer: Self-pay

## 2019-10-23 DIAGNOSIS — G935 Compression of brain: Secondary | ICD-10-CM | POA: Diagnosis not present

## 2019-10-23 DIAGNOSIS — I4891 Unspecified atrial fibrillation: Secondary | ICD-10-CM | POA: Diagnosis not present

## 2019-10-23 DIAGNOSIS — I6389 Other cerebral infarction: Secondary | ICD-10-CM | POA: Diagnosis not present

## 2019-10-23 DIAGNOSIS — I110 Hypertensive heart disease with heart failure: Secondary | ICD-10-CM | POA: Insufficient documentation

## 2019-10-23 DIAGNOSIS — R519 Headache, unspecified: Secondary | ICD-10-CM | POA: Insufficient documentation

## 2019-10-23 DIAGNOSIS — I509 Heart failure, unspecified: Secondary | ICD-10-CM | POA: Insufficient documentation

## 2019-10-23 DIAGNOSIS — M5124 Other intervertebral disc displacement, thoracic region: Secondary | ICD-10-CM | POA: Diagnosis not present

## 2019-10-23 DIAGNOSIS — R0902 Hypoxemia: Secondary | ICD-10-CM | POA: Diagnosis not present

## 2019-10-23 DIAGNOSIS — I6523 Occlusion and stenosis of bilateral carotid arteries: Secondary | ICD-10-CM | POA: Diagnosis not present

## 2019-10-23 DIAGNOSIS — Z79899 Other long term (current) drug therapy: Secondary | ICD-10-CM | POA: Diagnosis not present

## 2019-10-23 DIAGNOSIS — R000 Tachycardia, unspecified: Secondary | ICD-10-CM | POA: Diagnosis not present

## 2019-10-23 DIAGNOSIS — M4804 Spinal stenosis, thoracic region: Secondary | ICD-10-CM | POA: Diagnosis not present

## 2019-10-23 DIAGNOSIS — S299XXA Unspecified injury of thorax, initial encounter: Secondary | ICD-10-CM | POA: Diagnosis not present

## 2019-10-23 DIAGNOSIS — M546 Pain in thoracic spine: Secondary | ICD-10-CM | POA: Insufficient documentation

## 2019-10-23 DIAGNOSIS — M5126 Other intervertebral disc displacement, lumbar region: Secondary | ICD-10-CM | POA: Diagnosis not present

## 2019-10-23 DIAGNOSIS — M47816 Spondylosis without myelopathy or radiculopathy, lumbar region: Secondary | ICD-10-CM | POA: Diagnosis not present

## 2019-10-23 DIAGNOSIS — M4856XA Collapsed vertebra, not elsewhere classified, lumbar region, initial encounter for fracture: Secondary | ICD-10-CM | POA: Diagnosis not present

## 2019-10-23 DIAGNOSIS — S0990XA Unspecified injury of head, initial encounter: Secondary | ICD-10-CM | POA: Diagnosis not present

## 2019-10-23 DIAGNOSIS — M545 Low back pain: Secondary | ICD-10-CM | POA: Diagnosis not present

## 2019-10-23 DIAGNOSIS — M47814 Spondylosis without myelopathy or radiculopathy, thoracic region: Secondary | ICD-10-CM | POA: Diagnosis not present

## 2019-10-23 DIAGNOSIS — S3992XA Unspecified injury of lower back, initial encounter: Secondary | ICD-10-CM | POA: Diagnosis not present

## 2019-10-23 DIAGNOSIS — R52 Pain, unspecified: Secondary | ICD-10-CM | POA: Diagnosis not present

## 2019-10-23 MED ORDER — HYDROCODONE-ACETAMINOPHEN 5-325 MG PO TABS
1.0000 | ORAL_TABLET | Freq: Once | ORAL | Status: AC
  Administered 2019-10-23: 1 via ORAL
  Filled 2019-10-23: qty 1

## 2019-10-23 MED ORDER — HYDROCODONE-ACETAMINOPHEN 5-325 MG PO TABS
1.0000 | ORAL_TABLET | Freq: Four times a day (QID) | ORAL | Status: AC | PRN
  Administered 2019-10-23: 1 via ORAL
  Filled 2019-10-23: qty 1

## 2019-10-23 NOTE — Discharge Instructions (Addendum)
Mr Mihelich had CT scans of the head and spine today.  There were no acute findings on his scans.  He has extensive underlying disease, osteopenia and weakening of his bones on his CT scan.  He may have a pinched nerve in the thoracic (mid-back) region.  At this time there is no surgical emergency.  His doctor should arrange for an MRI of the thoracic and lumbar spine without contrast at th next available convenience.  For pain, I saw from the facility list that the patient was prescribed Norco as needed, and can continue this at his facility.  He got norco in the ED.  If Akram develops numbness or weakness in his legs (can't move them, dragging them), he needs to return to the ER immediately.  These can be signs of worsening spinal cord impingement.

## 2019-10-23 NOTE — ED Notes (Signed)
[facility] called for transport.  

## 2019-10-23 NOTE — ED Triage Notes (Addendum)
Pt arrived from [place], witnessed fall, pt reaching for [facility] controls, fell and hit lower back on recliner, c/o lower back pain. No LOC, no head strike. No neck pain.   Given 148mcg fentanyl and 4mg  zofran by EMS

## 2019-10-23 NOTE — ED Provider Notes (Signed)
[facility] DEPT Provider Note   CSN: [id number] Arrival date & time: 10/23/19  1614     History Chief Complaint  Patient presents with  . Fall  . Back Pain    Duane Spears is a 86 y.o. male with a history of A. fib not on blood thinners presenting from a nursing home with reported mechanical fall today.  The patient comes from [facility] witnessed fall while reaching for the air conditioning controls, fell backwards and hit his lower back on a recliner.  He was complaining of mid to lower back.  There is no obvious sign of head trauma at that time, the patient thinks may have bumped his head.  He is denying any headache.  He does report significant 10 out of 10 mid back pain.  He was given 100 mcg of fentanyl by EMS and 4 mg of Zofran prior to arrival.  The patient denies any radiation of pain down his arms or his legs.  He denies any upper neck pain.  Denies any weakness or numbness in his legs.  HPI     Past Medical History:  Diagnosis Date  . A-fib ([place])   . Bradycardia   . CHF (congestive heart failure) ([place])   . Prostate disorder     Patient Active Problem List   Diagnosis Date Noted  . Essential hypertension   . Periprosthetic fracture around internal prosthetic left hip joint ([place]) 10/17/2017  . Left displaced femoral neck fracture ([place]) 10/16/2017  . UTI (lower urinary tract infection) 10/11/2013  . Atrial fibrillation ([place]) 10/11/2013  . Leukocytosis 10/11/2013  . Benign prostatic hyperplasia 10/11/2013  . Pseudomonas infection 10/11/2013    Past Surgical History:  Procedure Laterality Date  . APPENDECTOMY    . COLONOSCOPY    . HIP SURGERY         No family history on file.  Social History   Tobacco Use  . Smoking status: Never Smoker  . Smokeless tobacco: Never Used  Substance Use Topics  . Alcohol use: No  . Drug use: Not on file    Home Medications Prior to Admission medications   Medication Sig  Start Date End Date Taking? Authorizing Provider  bisacodyl (DULCOLAX) 5 MG EC tablet Take 1 tablet (5 mg total) by mouth daily as needed for moderate constipation. 10/18/17   Eugenie Filler, MD  finasteride (PROSCAR) 5 MG tablet Take 5 mg by mouth daily.    [provider]  furosemide (LASIX) 20 MG tablet Take 60-100 mg by mouth 2 (two) times daily. 100mg  (5 tablets) in the AM and 60mg  (3 tablets) in the evening    [provider]  heparin 5000 UNIT/ML injection Inject 1 mL (5,000 Units total) into the skin every 8 (eight) hours. 10/18/17   Eugenie Filler, MD  HYDROcodone-acetaminophen (NORCO/VICODIN) 5-325 MG tablet Take 1-2 tablets by mouth every 6 (six) hours as needed for moderate pain. 10/18/17   Eugenie Filler, MD  losartan (COZAAR) 50 MG tablet Take 1 tablet by mouth daily. 09/25/17   [provider]  methocarbamol (ROBAXIN) 500 MG tablet Take 1 tablet (500 mg total) by mouth every 8 (eight) hours as needed for muscle spasms. 10/18/17   Eugenie Filler, MD  Multiple Vitamin (MULTIVITAMIN WITH MINERALS) TABS tablet Take 1 tablet by mouth daily.    [provider]  Multiple Vitamins-Minerals (PRESERVISION AREDS 2) CAPS Take 1 capsule by mouth 2 (two) times daily.  [provider]  senna-docusate (SENOKOT-S) 8.6-50 MG tablet Take 1 tablet by mouth at bedtime. 10/18/17   Eugenie Filler, MD  THIAMINE HCL PO Take 1 tablet by mouth daily.    [provider]    Allergies    Patient has no known allergies.  Review of Systems   Review of Systems  Constitutional: Negative for chills and fever.  HENT: Negative for ear pain and sore throat.   Eyes: Negative for pain and visual disturbance.  Respiratory: Negative for cough and shortness of breath.   Cardiovascular: Negative for chest pain and palpitations.  Gastrointestinal: Negative for abdominal pain and vomiting.  Genitourinary: Negative for dysuria and hematuria.    Musculoskeletal: Positive for arthralgias and back pain.  Skin: Negative for color change and rash.  Neurological: Negative for weakness, numbness and headaches.  Psychiatric/Behavioral: Negative for agitation and confusion.  All other systems reviewed and are negative.   Physical Exam Updated Vital Signs BP 133/69   Pulse 65   Temp 97.7 F (36.5 C) (Oral)   Resp 20   SpO2 100%   Physical Exam Vitals and nursing note reviewed.  Constitutional:      Appearance: He is well-developed.     Comments: Hard of hearing  HENT:     Head: Normocephalic and atraumatic.  Eyes:     Conjunctiva/sclera: Conjunctivae normal.  Cardiovascular:     Rate and Rhythm: Normal rate and regular rhythm.     Pulses: Normal pulses.  Pulmonary:     Effort: Pulmonary effort is normal. No respiratory distress.     Breath sounds: Normal breath sounds.  Abdominal:     Palpations: Abdomen is soft.     Tenderness: There is no abdominal tenderness.  Musculoskeletal:     Cervical back: Neck supple.     Comments: +paraspinal mid-thoracic back pain +midline T and L spine tenderness No midline C-spine tenderness  Skin:    General: Skin is warm and dry.  Neurological:     General: No focal deficit present.     Mental Status: He is alert and oriented to person, place, and time.     Sensory: No sensory deficit.     Motor: No weakness.     ED Results / Procedures / Treatments   Labs (all labs ordered are listed, but only abnormal results are displayed) Labs Reviewed - No data to display  EKG None  Radiology CT Head Wo Contrast  Result Date: 10/23/2019 CLINICAL DATA:  Head trauma EXAM: CT HEAD WITHOUT CONTRAST TECHNIQUE: Contiguous axial images were obtained from the base of the skull through the vertex without intravenous contrast. COMPARISON:  None. FINDINGS: Brain: 2 focal defects are seen within the left orbital roof with herniation of a small portion of the basilar left frontal lobe into the  superior left orbit in keeping with a small encephaloceles. This may be posttraumatic or postsurgical in nature. This is best seen on axial image # 12 and 13/2 and coronal image # 19 and 17/4. Moderate parenchymal volume loss is commensurate with the patient's age. Moderate periventricular white matter changes are present likely reflecting the sequela of small vessel ischemia. A remote left frontal cortical infarct is noted. No abnormal intra or extra-axial mass lesion or fluid collection. No abnormal mass effect or midline shift. No evidence of acute intracranial hemorrhage or infarct. Ventricular size is normal. Cerebellum unremarkable. Vascular: Extensive atherosclerotic calcification within the terminal vertebral arteries and carotid siphons bilaterally are noted. No asymmetric hyperdense  vasculature at the skull base. Skull: Intact Sinuses/Orbits: Paranasal sinuses are clear. Orbits are otherwise unremarkable. Other: Mastoid air cells and middle ear cavities are clear. IMPRESSION: 1. No acute intracranial abnormality. 2. Moderate parenchymal volume loss and periventricular white matter changes likely reflecting the sequela of small vessel ischemia. 3. Remote left frontal cortical infarct. 4. Small left orbital roof encephaloceles, which may be posttraumatic or postsurgical in nature. Electronically Signed   By: Fidela Salisbury MD   On: 10/23/2019 19:43   CT Thoracic Spine Wo Contrast  Result Date: 10/23/2019 CLINICAL DATA:  Fall, back injury, back pain EXAM: CT THORACIC SPINE WITHOUT CONTRAST TECHNIQUE: Multidetector CT images of the thoracic were obtained using the standard protocol without intravenous contrast. COMPARISON:  None. FINDINGS: Alignment: Normal thoracic kyphosis.  No listhesis. Vertebrae: The osseous structures are diffusely osteopenic. There is no acute fracture of the thoracic spine. Vertebral body height has been preserved. No lytic or blastic bone lesions. Paraspinal and other soft  tissues: The paraspinal soft tissues are unremarkable. Reticular infiltrate within the dependent visualized lower lobes likely represents atelectasis. The central pulmonary arteries are enlarged in keeping with changes of pulmonary arterial hypertension. Extensive coronary artery calcification is seen within the left main coronary artery. Cholelithiasis noted. 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 Disc levels: There is intervertebral disc space narrowing and disc calcification throughout the thoracic spine in keeping with diffuse degenerative disc disease. There is ossification of the anterior longitudinal ligament, likely degenerative in nature. The spinal canal is widely patent on sagittal imaging. Review of the axial images demonstrates multiple level degenerative change with resultant severe right neural foraminal narrowing at T3-4 secondary to facet arthrosis. Remaining neural foramina are widely patent. Posterior disc osteophyte complex in combination with mild facet arthrosis results in mild central canal stenosis with mild flattening of the thecal sac at T9-10. Posterior disc herniation results in moderate central canal stenosis and bilateral moderate to severe neural foraminal narrowing at T10-11. Posterior disc herniation in combination with facet arthrosis results in moderate central canal stenosis and moderate bilateral neural foraminal narrowing at T11-12. IMPRESSION: No acute fracture or traumatic listhesis of the thoracic spine. Note that imaging is limited by severe osteopenia. Multilevel central canal stenosis at T9-T12 secondary to degenerative change in posterior disc herniations. This would be better assessed with MRI examination if indicated. Electronically Signed   By: Fidela Salisbury MD   On: 10/23/2019 19:57   CT Lumbar Spine Wo Contrast  Result Date: 10/23/2019 CLINICAL DATA:  Fall, back pain, EXAM: CT LUMBAR SPINE WITHOUT CONTRAST TECHNIQUE:  Multidetector CT imaging of the lumbar spine was performed without intravenous contrast administration. Multiplanar CT image reconstructions were also generated. COMPARISON:  None. FINDINGS: Segmentation: 5 lumbar type vertebrae. Alignment: There is normal lumbar lordosis. Mild lumbar levoscoliosis, apex left at L2 of approximately 20 degrees. No listhesis of the lumbar spine. Vertebrae: There is severe osteopenia of the lumbar spine. No acute fracture. There is mild loss of height of the L4 and L5 vertebral bodies in keeping with remote compression with approximately 20-30% loss of height. There is bridging osteophyte formation at L3-4 and fusion of the vertebral bodies of L2-3. Paraspinal and other soft tissues: The paraspinal soft tissues are unremarkable. Disc levels: Vacuum disc phenomena is noted at L4-5 and L5-S1 and there is otherwise intervertebral disc space narrowing and discal calcifications in keeping with changes of diffuse degenerative disc disease. Axial images demonstrate: L1-2: Posterior disc osteophyte complex  in combination with mild facet arthrosis results in mild bilateral neural foraminal narrowing. No significant canal stenosis. L2-3: Mild right facet arthrosis results in mild right neural foraminal narrowing. No significant canal stenosis. L3-4: Broad-based disc bulge in combination with facet arthrosis results in moderate right neural foraminal narrowing. No significant canal stenosis. L4-5: Posterior broad-based disc bulge in combination with moderate bilateral facet arthrosis results in mild bilateral neural foraminal narrowing. No significant canal stenosis. L5-S1: No significant neural foraminal narrowing or canal stenosis. IMPRESSION: No acute fracture or listhesis of the lumbar spine, though imaging is limited by severe osteopenia. Remote compression fractures of L4 and L5. Advanced multilevel degenerative changes as outlined above. Electronically Signed   By: Fidela Salisbury MD   On:  10/23/2019 20:07    Procedures Procedures (including critical care time)  Medications Ordered in ED Medications  HYDROcodone-acetaminophen (NORCO/VICODIN) 5-325 MG per tablet 1 tablet (1 tablet Oral Given 10/23/19 1756)  HYDROcodone-acetaminophen (NORCO/VICODIN) 5-325 MG per tablet 1 tablet (1 tablet Oral Given 10/23/19 2342)    ED Course  I have reviewed the triage vital signs and the nursing notes.  Pertinent labs & imaging results that were available during my care of the patient were reviewed by me and considered in my medical decision making (see chart for details).  86 yo male here with mid-thoracic back pain after a mechanical fall at his facility, here has reproducible mid-back pain.  He reportedly struck his back against an object falling.  He also reports bumping his head.    Plan for [place], CT T and L-spine  We'll give norco for pain here.  He has no acute neurological deficits to suggest cord compression.  Good strength in his legs.  His abdominal exam is benign.  Doubtful of acute intraabdominal.  No focal chest wall ttp on exam to suggest acute rib fracture.   CT scans pending  Clinical Course as of Oct 24 3  Wed Oct 23, 2019  2010 IMPRESSION: No acute fracture or listhesis of the lumbar spine, though imaging is limited by severe osteopenia. Remote compression fractures of L4 and L5. Advanced multilevel degenerative changes as outlined above.   [MT]  2010 There is significant chronic disease noted on the patient's CT imaging.  However there are no acute fractures noted.  I will discharge him back to his nursing facility with recommendations may continue the Norco dosing as needed, and if he has persistent pain they will need to obtain an MRI of his T and L-spine.   [MT]    Clinical Course User Index [MT] Deva Ron, Carola Rhine, MD    Final Clinical Impression(s) / ED Diagnoses Final diagnoses:  Acute thoracic back pain, unspecified back pain laterality    Rx  / DC Orders ED Discharge Orders    None       Langston Masker Carola Rhine, MD 10/24/19 0005

## 2019-11-07 DIAGNOSIS — Z79899 Other long term (current) drug therapy: Secondary | ICD-10-CM | POA: Diagnosis not present

## 2019-11-07 DIAGNOSIS — D649 Anemia, unspecified: Secondary | ICD-10-CM | POA: Diagnosis not present

## 2019-11-07 DIAGNOSIS — E1151 Type 2 diabetes mellitus with diabetic peripheral angiopathy without gangrene: Secondary | ICD-10-CM | POA: Diagnosis not present

## 2019-12-16 DIAGNOSIS — D649 Anemia, unspecified: Secondary | ICD-10-CM | POA: Diagnosis not present

## 2020-02-12 DIAGNOSIS — L03119 Cellulitis of unspecified part of limb: Secondary | ICD-10-CM | POA: Diagnosis not present

## 2020-02-17 DIAGNOSIS — D649 Anemia, unspecified: Secondary | ICD-10-CM | POA: Diagnosis not present

## 2020-02-17 DIAGNOSIS — Z79899 Other long term (current) drug therapy: Secondary | ICD-10-CM | POA: Diagnosis not present

## 2020-02-25 DIAGNOSIS — L988 Other specified disorders of the skin and subcutaneous tissue: Secondary | ICD-10-CM | POA: Diagnosis not present

## 2020-03-03 DIAGNOSIS — L988 Other specified disorders of the skin and subcutaneous tissue: Secondary | ICD-10-CM | POA: Diagnosis not present

## 2020-03-10 DIAGNOSIS — L988 Other specified disorders of the skin and subcutaneous tissue: Secondary | ICD-10-CM | POA: Diagnosis not present

## 2020-03-17 DIAGNOSIS — L988 Other specified disorders of the skin and subcutaneous tissue: Secondary | ICD-10-CM | POA: Diagnosis not present

## 2020-03-24 DIAGNOSIS — L988 Other specified disorders of the skin and subcutaneous tissue: Secondary | ICD-10-CM | POA: Diagnosis not present

## 2020-03-31 DIAGNOSIS — L988 Other specified disorders of the skin and subcutaneous tissue: Secondary | ICD-10-CM | POA: Diagnosis not present

## 2020-04-07 DIAGNOSIS — L988 Other specified disorders of the skin and subcutaneous tissue: Secondary | ICD-10-CM | POA: Diagnosis not present

## 2020-04-14 DIAGNOSIS — L988 Other specified disorders of the skin and subcutaneous tissue: Secondary | ICD-10-CM | POA: Diagnosis not present

## 2020-04-21 DIAGNOSIS — L988 Other specified disorders of the skin and subcutaneous tissue: Secondary | ICD-10-CM | POA: Diagnosis not present

## 2020-04-28 DIAGNOSIS — L988 Other specified disorders of the skin and subcutaneous tissue: Secondary | ICD-10-CM | POA: Diagnosis not present

## 2020-05-05 DIAGNOSIS — L988 Other specified disorders of the skin and subcutaneous tissue: Secondary | ICD-10-CM | POA: Diagnosis not present

## 2020-05-06 DIAGNOSIS — I509 Heart failure, unspecified: Secondary | ICD-10-CM | POA: Diagnosis not present

## 2020-05-06 DIAGNOSIS — D649 Anemia, unspecified: Secondary | ICD-10-CM | POA: Diagnosis not present

## 2020-05-12 DIAGNOSIS — L988 Other specified disorders of the skin and subcutaneous tissue: Secondary | ICD-10-CM | POA: Diagnosis not present

## 2020-05-14 DIAGNOSIS — R1312 Dysphagia, oropharyngeal phase: Secondary | ICD-10-CM | POA: Diagnosis not present

## 2020-05-14 DIAGNOSIS — I4891 Unspecified atrial fibrillation: Secondary | ICD-10-CM | POA: Diagnosis not present

## 2020-05-14 DIAGNOSIS — I10 Essential (primary) hypertension: Secondary | ICD-10-CM | POA: Diagnosis not present

## 2020-05-14 DIAGNOSIS — I5030 Unspecified diastolic (congestive) heart failure: Secondary | ICD-10-CM | POA: Diagnosis not present

## 2020-05-14 DIAGNOSIS — M5459 Other low back pain: Secondary | ICD-10-CM | POA: Diagnosis not present

## 2020-05-14 DIAGNOSIS — M6281 Muscle weakness (generalized): Secondary | ICD-10-CM | POA: Diagnosis not present

## 2020-05-14 DIAGNOSIS — R41841 Cognitive communication deficit: Secondary | ICD-10-CM | POA: Diagnosis not present

## 2020-05-14 DIAGNOSIS — R2681 Unsteadiness on feet: Secondary | ICD-10-CM | POA: Diagnosis not present

## 2020-05-14 DIAGNOSIS — R293 Abnormal posture: Secondary | ICD-10-CM | POA: Diagnosis not present

## 2020-05-15 DIAGNOSIS — E559 Vitamin D deficiency, unspecified: Secondary | ICD-10-CM | POA: Diagnosis not present

## 2020-05-15 DIAGNOSIS — Z79899 Other long term (current) drug therapy: Secondary | ICD-10-CM | POA: Diagnosis not present

## 2020-05-15 DIAGNOSIS — N390 Urinary tract infection, site not specified: Secondary | ICD-10-CM | POA: Diagnosis not present

## 2020-05-15 DIAGNOSIS — E0829 Diabetes mellitus due to underlying condition with other diabetic kidney complication: Secondary | ICD-10-CM | POA: Diagnosis not present

## 2020-05-17 DIAGNOSIS — R4181 Age-related cognitive decline: Secondary | ICD-10-CM | POA: Diagnosis not present

## 2020-05-17 DIAGNOSIS — I4891 Unspecified atrial fibrillation: Secondary | ICD-10-CM | POA: Diagnosis not present

## 2020-05-17 DIAGNOSIS — I739 Peripheral vascular disease, unspecified: Secondary | ICD-10-CM | POA: Diagnosis not present

## 2020-05-17 DIAGNOSIS — M62838 Other muscle spasm: Secondary | ICD-10-CM | POA: Diagnosis not present

## 2020-05-17 DIAGNOSIS — N400 Enlarged prostate without lower urinary tract symptoms: Secondary | ICD-10-CM | POA: Diagnosis not present

## 2020-05-17 DIAGNOSIS — E1122 Type 2 diabetes mellitus with diabetic chronic kidney disease: Secondary | ICD-10-CM | POA: Diagnosis not present

## 2020-05-17 DIAGNOSIS — N1831 Chronic kidney disease, stage 3a: Secondary | ICD-10-CM | POA: Diagnosis not present

## 2020-05-17 DIAGNOSIS — I509 Heart failure, unspecified: Secondary | ICD-10-CM | POA: Diagnosis not present

## 2020-05-19 DIAGNOSIS — R0989 Other specified symptoms and signs involving the circulatory and respiratory systems: Secondary | ICD-10-CM | POA: Diagnosis not present

## 2020-05-19 DIAGNOSIS — R489 Unspecified symbolic dysfunctions: Secondary | ICD-10-CM | POA: Diagnosis not present

## 2020-05-19 DIAGNOSIS — J189 Pneumonia, unspecified organism: Secondary | ICD-10-CM | POA: Diagnosis not present

## 2020-05-19 DIAGNOSIS — R1319 Other dysphagia: Secondary | ICD-10-CM | POA: Diagnosis not present

## 2020-05-19 DIAGNOSIS — K219 Gastro-esophageal reflux disease without esophagitis: Secondary | ICD-10-CM | POA: Diagnosis not present

## 2020-05-20 DIAGNOSIS — L988 Other specified disorders of the skin and subcutaneous tissue: Secondary | ICD-10-CM | POA: Diagnosis not present

## 2020-05-25 DIAGNOSIS — N184 Chronic kidney disease, stage 4 (severe): Secondary | ICD-10-CM | POA: Diagnosis not present

## 2020-05-25 DIAGNOSIS — E860 Dehydration: Secondary | ICD-10-CM | POA: Diagnosis not present

## 2020-05-26 DIAGNOSIS — N184 Chronic kidney disease, stage 4 (severe): Secondary | ICD-10-CM | POA: Diagnosis not present

## 2020-05-26 DIAGNOSIS — E860 Dehydration: Secondary | ICD-10-CM | POA: Diagnosis not present

## 2020-05-27 DIAGNOSIS — L988 Other specified disorders of the skin and subcutaneous tissue: Secondary | ICD-10-CM | POA: Diagnosis not present

## 2020-06-01 DIAGNOSIS — N184 Chronic kidney disease, stage 4 (severe): Secondary | ICD-10-CM | POA: Diagnosis not present

## 2020-06-01 DIAGNOSIS — E860 Dehydration: Secondary | ICD-10-CM | POA: Diagnosis not present

## 2020-06-03 DIAGNOSIS — L988 Other specified disorders of the skin and subcutaneous tissue: Secondary | ICD-10-CM | POA: Diagnosis not present

## 2020-06-10 DIAGNOSIS — L988 Other specified disorders of the skin and subcutaneous tissue: Secondary | ICD-10-CM | POA: Diagnosis not present

## 2020-06-17 DIAGNOSIS — L988 Other specified disorders of the skin and subcutaneous tissue: Secondary | ICD-10-CM | POA: Diagnosis not present

## 2020-06-24 DIAGNOSIS — L988 Other specified disorders of the skin and subcutaneous tissue: Secondary | ICD-10-CM | POA: Diagnosis not present

## 2020-07-01 DIAGNOSIS — L988 Other specified disorders of the skin and subcutaneous tissue: Secondary | ICD-10-CM | POA: Diagnosis not present

## 2020-07-04 DIAGNOSIS — Z79899 Other long term (current) drug therapy: Secondary | ICD-10-CM | POA: Diagnosis not present

## 2020-07-08 DIAGNOSIS — L988 Other specified disorders of the skin and subcutaneous tissue: Secondary | ICD-10-CM | POA: Diagnosis not present

## 2020-07-09 DIAGNOSIS — L97509 Non-pressure chronic ulcer of other part of unspecified foot with unspecified severity: Secondary | ICD-10-CM | POA: Diagnosis not present

## 2020-07-15 DIAGNOSIS — M7989 Other specified soft tissue disorders: Secondary | ICD-10-CM | POA: Diagnosis not present

## 2020-07-15 DIAGNOSIS — L988 Other specified disorders of the skin and subcutaneous tissue: Secondary | ICD-10-CM | POA: Diagnosis not present

## 2020-07-20 DIAGNOSIS — N390 Urinary tract infection, site not specified: Secondary | ICD-10-CM | POA: Diagnosis not present

## 2020-07-20 DIAGNOSIS — E0829 Diabetes mellitus due to underlying condition with other diabetic kidney complication: Secondary | ICD-10-CM | POA: Diagnosis not present

## 2020-07-20 DIAGNOSIS — E559 Vitamin D deficiency, unspecified: Secondary | ICD-10-CM | POA: Diagnosis not present

## 2020-07-20 DIAGNOSIS — N184 Chronic kidney disease, stage 4 (severe): Secondary | ICD-10-CM | POA: Diagnosis not present

## 2020-07-20 DIAGNOSIS — E860 Dehydration: Secondary | ICD-10-CM | POA: Diagnosis not present

## 2020-07-21 DIAGNOSIS — I10 Essential (primary) hypertension: Secondary | ICD-10-CM | POA: Diagnosis not present

## 2020-07-21 DIAGNOSIS — I5030 Unspecified diastolic (congestive) heart failure: Secondary | ICD-10-CM | POA: Diagnosis not present

## 2020-07-21 DIAGNOSIS — M6281 Muscle weakness (generalized): Secondary | ICD-10-CM | POA: Diagnosis not present

## 2020-07-21 DIAGNOSIS — R2681 Unsteadiness on feet: Secondary | ICD-10-CM | POA: Diagnosis not present

## 2020-07-21 DIAGNOSIS — R293 Abnormal posture: Secondary | ICD-10-CM | POA: Diagnosis not present

## 2020-07-22 DIAGNOSIS — L89620 Pressure ulcer of left heel, unstageable: Secondary | ICD-10-CM | POA: Diagnosis not present

## 2020-07-22 DIAGNOSIS — L988 Other specified disorders of the skin and subcutaneous tissue: Secondary | ICD-10-CM | POA: Diagnosis not present

## 2020-07-25 DIAGNOSIS — N184 Chronic kidney disease, stage 4 (severe): Secondary | ICD-10-CM | POA: Diagnosis not present

## 2020-07-25 DIAGNOSIS — E559 Vitamin D deficiency, unspecified: Secondary | ICD-10-CM | POA: Diagnosis not present

## 2020-07-25 DIAGNOSIS — E0829 Diabetes mellitus due to underlying condition with other diabetic kidney complication: Secondary | ICD-10-CM | POA: Diagnosis not present

## 2020-07-25 DIAGNOSIS — E860 Dehydration: Secondary | ICD-10-CM | POA: Diagnosis not present

## 2020-07-29 DIAGNOSIS — L89620 Pressure ulcer of left heel, unstageable: Secondary | ICD-10-CM | POA: Diagnosis not present

## 2020-07-29 DIAGNOSIS — L988 Other specified disorders of the skin and subcutaneous tissue: Secondary | ICD-10-CM | POA: Diagnosis not present

## 2020-07-31 ENCOUNTER — Encounter: Payer: Self-pay | Admitting: Vascular Surgery

## 2020-07-31 ENCOUNTER — Other Ambulatory Visit: Payer: Self-pay

## 2020-07-31 ENCOUNTER — Ambulatory Visit: Payer: PPO | Admitting: Vascular Surgery

## 2020-07-31 VITALS — BP 101/53 | HR 56 | Temp 98.0°F | Resp 20 | Ht 74.0 in | Wt 224.0 lb

## 2020-07-31 DIAGNOSIS — R600 Localized edema: Secondary | ICD-10-CM | POA: Diagnosis not present

## 2020-07-31 DIAGNOSIS — I739 Peripheral vascular disease, unspecified: Secondary | ICD-10-CM | POA: Diagnosis not present

## 2020-07-31 NOTE — Progress Notes (Signed)
Patient ID: Duane Spears, male   DOB: Nov 11, 1920, 87 y.o.   MRN: [id number]  Reason for Consult: New Patient (Initial Visit)   Referred by Levin Erp, MD  Subjective:     HPI:  Duane Spears is a 87 y.o. male resident of [facility].  He has congestive heart failure.  He has recently developed very edematous bilateral lower extremities.  He does sit in his wheelchair most of the day.  He has difficulty hearing most history obtained from his son and from paperwork sent from [facility].  He has developed a wound on the anterior left leg that has been present for many months.  This is somewhat worsening and has begun to weep with increasing swelling in his legs.  He has not had any fevers or chills.  He has no tissue loss or ulceration on his feet.  Denies any previous history of vascular intervention.  He is mostly nonambulatory but can transfer.  Past Medical History:  Diagnosis Date  . A-fib ([place])   . Bradycardia   . CHF (congestive heart failure) ([place])   . Prostate disorder    History reviewed. No pertinent family history. Past Surgical History:  Procedure Laterality Date  . APPENDECTOMY    . COLONOSCOPY    . HIP SURGERY      Short Social History:  Social History   Tobacco Use  . Smoking status: Never Smoker  . Smokeless tobacco: Never Used  Substance Use Topics  . Alcohol use: No    No Known Allergies  Current Outpatient Medications  Medication Sig Dispense Refill  . bisacodyl (DULCOLAX) 5 MG EC tablet Take 1 tablet (5 mg total) by mouth daily as needed for moderate constipation. 30 tablet 0  . finasteride (PROSCAR) 5 MG tablet Take 5 mg by mouth daily.    . furosemide (LASIX) 20 MG tablet Take 60-100 mg by mouth 2 (two) times daily. 100mg  (5 tablets) in the AM and 60mg  (3 tablets) in the evening    . heparin 5000 UNIT/ML injection Inject 1 mL (5,000 Units total) into the skin every 8 (eight) hours. 1 mL   . HYDROcodone-acetaminophen (NORCO/VICODIN) 5-325 MG  tablet Take 1-2 tablets by mouth every 6 (six) hours as needed for moderate pain. 15 tablet 0  . LANTUS SOLOSTAR 100 UNIT/ML Solostar Pen     . losartan (COZAAR) 50 MG tablet Take 1 tablet by mouth daily.    . methocarbamol (ROBAXIN) 500 MG tablet Take 1 tablet (500 mg total) by mouth every 8 (eight) hours as needed for muscle spasms. 20 tablet 0  . Multiple Vitamin (MULTIVITAMIN WITH MINERALS) TABS tablet Take 1 tablet by mouth daily.    . Multiple Vitamins-Minerals (PRESERVISION AREDS 2) CAPS Take 1 capsule by mouth 2 (two) times daily.    Marland Kitchen senna-docusate (SENOKOT-S) 8.6-50 MG tablet Take 1 tablet by mouth at bedtime.    . THIAMINE HCL PO Take 1 tablet by mouth daily.     No current facility-administered medications for this visit.    Review of Systems  Constitutional:  Constitutional negative. HENT:       Hearing difficulty Eyes: Eyes negative.  Respiratory: Respiratory negative.  Cardiovascular: Positive for leg swelling.  GI: Gastrointestinal negative.  Skin: Positive for wound.  Neurological: Neurological negative. Hematologic: Hematologic/lymphatic negative.  Psychiatric: Psychiatric negative.        Objective:  Objective   Vitals:   07/31/20 1431  BP: (!) 101/53  Pulse: (!) 56  Resp: 20  Temp: 98 F (36.7 C)  SpO2: 98%  Weight: 224 lb (101.6 kg)  Height: 6\' 2"  (1.88 m)   Body mass index is 28.76 kg/m.  Physical Exam HENT:     Head: Normocephalic.     Nose:     Comments: Wearing a mask Eyes:     Pupils: Pupils are equal, round, and reactive to light.  Cardiovascular:     Pulses:          Dorsalis pedis pulses are detected w/ Doppler on the right side and detected w/ Doppler on the left side.       Posterior tibial pulses are detected w/ Doppler on the right side and 0 on the left side.     Comments: I cannot palpate femoral pulses due to patient's positioning Abdominal:     General: Abdomen is flat.     Palpations: Abdomen is soft.  Musculoskeletal:      Left lower leg: Edema present.  Skin:    Capillary Refill: Capillary refill takes 2 to 3 seconds.     Comments: Wound as below  Neurological:     Mental Status: He is alert.  Psychiatric:        Mood and Affect: Mood normal.       Data:        Assessment/Plan:     87 year old male [employer] War [employer] veteran mostly confined to his wheelchair with bilateral lower extremity edema and a wound on the anterior aspect of his left leg as pictured above.  He does have monophasic waveforms throughout his bilateral lower extremities with likely SFA and tibial disease.  I discussed with the patient and his son that we could attempt revascularization but with this much edema he is unlikely to heal the wound.  At this time they do not want any procedures performed.  I have recommended at least gentle compression and leg elevation as tolerated to help with the edema and possibly can get the wound to heal or at least prevent it from becoming infected.  They will follow-up on an as-needed basis.     Waynetta Sandy MD [facility]

## 2020-08-05 DIAGNOSIS — L89620 Pressure ulcer of left heel, unstageable: Secondary | ICD-10-CM | POA: Diagnosis not present

## 2020-08-05 DIAGNOSIS — L988 Other specified disorders of the skin and subcutaneous tissue: Secondary | ICD-10-CM | POA: Diagnosis not present

## 2020-08-12 DIAGNOSIS — L89620 Pressure ulcer of left heel, unstageable: Secondary | ICD-10-CM | POA: Diagnosis not present

## 2020-08-12 DIAGNOSIS — L988 Other specified disorders of the skin and subcutaneous tissue: Secondary | ICD-10-CM | POA: Diagnosis not present

## 2020-08-13 DIAGNOSIS — R293 Abnormal posture: Secondary | ICD-10-CM | POA: Diagnosis not present

## 2020-08-13 DIAGNOSIS — I5030 Unspecified diastolic (congestive) heart failure: Secondary | ICD-10-CM | POA: Diagnosis not present

## 2020-08-13 DIAGNOSIS — I509 Heart failure, unspecified: Secondary | ICD-10-CM | POA: Diagnosis not present

## 2020-08-13 DIAGNOSIS — M6281 Muscle weakness (generalized): Secondary | ICD-10-CM | POA: Diagnosis not present

## 2020-08-13 DIAGNOSIS — I10 Essential (primary) hypertension: Secondary | ICD-10-CM | POA: Diagnosis not present

## 2020-08-13 DIAGNOSIS — R279 Unspecified lack of coordination: Secondary | ICD-10-CM | POA: Diagnosis not present

## 2020-08-13 DIAGNOSIS — R2681 Unsteadiness on feet: Secondary | ICD-10-CM | POA: Diagnosis not present

## 2020-08-14 DIAGNOSIS — I5020 Unspecified systolic (congestive) heart failure: Secondary | ICD-10-CM | POA: Diagnosis not present

## 2020-08-14 DIAGNOSIS — E559 Vitamin D deficiency, unspecified: Secondary | ICD-10-CM | POA: Diagnosis not present

## 2020-08-14 DIAGNOSIS — E0829 Diabetes mellitus due to underlying condition with other diabetic kidney complication: Secondary | ICD-10-CM | POA: Diagnosis not present

## 2020-08-14 DIAGNOSIS — E860 Dehydration: Secondary | ICD-10-CM | POA: Diagnosis not present

## 2020-08-17 DIAGNOSIS — L602 Onychogryphosis: Secondary | ICD-10-CM | POA: Diagnosis not present

## 2020-08-17 DIAGNOSIS — E1140 Type 2 diabetes mellitus with diabetic neuropathy, unspecified: Secondary | ICD-10-CM | POA: Diagnosis not present

## 2020-08-19 DIAGNOSIS — L988 Other specified disorders of the skin and subcutaneous tissue: Secondary | ICD-10-CM | POA: Diagnosis not present

## 2020-08-19 DIAGNOSIS — L89622 Pressure ulcer of left heel, stage 2: Secondary | ICD-10-CM | POA: Diagnosis not present

## 2020-08-21 DIAGNOSIS — E0829 Diabetes mellitus due to underlying condition with other diabetic kidney complication: Secondary | ICD-10-CM | POA: Diagnosis not present

## 2020-08-21 DIAGNOSIS — I5020 Unspecified systolic (congestive) heart failure: Secondary | ICD-10-CM | POA: Diagnosis not present

## 2020-08-21 DIAGNOSIS — E559 Vitamin D deficiency, unspecified: Secondary | ICD-10-CM | POA: Diagnosis not present

## 2020-08-21 DIAGNOSIS — E860 Dehydration: Secondary | ICD-10-CM | POA: Diagnosis not present

## 2020-08-26 DIAGNOSIS — L89622 Pressure ulcer of left heel, stage 2: Secondary | ICD-10-CM | POA: Diagnosis not present

## 2020-08-26 DIAGNOSIS — L988 Other specified disorders of the skin and subcutaneous tissue: Secondary | ICD-10-CM | POA: Diagnosis not present

## 2020-08-28 DIAGNOSIS — E0829 Diabetes mellitus due to underlying condition with other diabetic kidney complication: Secondary | ICD-10-CM | POA: Diagnosis not present

## 2020-08-28 DIAGNOSIS — E860 Dehydration: Secondary | ICD-10-CM | POA: Diagnosis not present

## 2020-08-28 DIAGNOSIS — E559 Vitamin D deficiency, unspecified: Secondary | ICD-10-CM | POA: Diagnosis not present

## 2020-08-28 DIAGNOSIS — I5020 Unspecified systolic (congestive) heart failure: Secondary | ICD-10-CM | POA: Diagnosis not present

## 2020-08-31 DIAGNOSIS — I5020 Unspecified systolic (congestive) heart failure: Secondary | ICD-10-CM | POA: Diagnosis not present

## 2020-08-31 DIAGNOSIS — E559 Vitamin D deficiency, unspecified: Secondary | ICD-10-CM | POA: Diagnosis not present

## 2020-08-31 DIAGNOSIS — E860 Dehydration: Secondary | ICD-10-CM | POA: Diagnosis not present

## 2020-08-31 DIAGNOSIS — E0829 Diabetes mellitus due to underlying condition with other diabetic kidney complication: Secondary | ICD-10-CM | POA: Diagnosis not present

## 2020-09-02 DIAGNOSIS — L988 Other specified disorders of the skin and subcutaneous tissue: Secondary | ICD-10-CM | POA: Diagnosis not present

## 2020-09-02 DIAGNOSIS — I83014 Varicose veins of right lower extremity with ulcer of heel and midfoot: Secondary | ICD-10-CM | POA: Diagnosis not present

## 2020-09-02 DIAGNOSIS — L89622 Pressure ulcer of left heel, stage 2: Secondary | ICD-10-CM | POA: Diagnosis not present

## 2020-09-03 DIAGNOSIS — R7983 Abnormal findings of blood amino-acid level: Secondary | ICD-10-CM | POA: Diagnosis not present

## 2020-09-03 DIAGNOSIS — I509 Heart failure, unspecified: Secondary | ICD-10-CM | POA: Diagnosis not present

## 2020-09-04 DIAGNOSIS — E0829 Diabetes mellitus due to underlying condition with other diabetic kidney complication: Secondary | ICD-10-CM | POA: Diagnosis not present

## 2020-09-04 DIAGNOSIS — E559 Vitamin D deficiency, unspecified: Secondary | ICD-10-CM | POA: Diagnosis not present

## 2020-09-04 DIAGNOSIS — I5020 Unspecified systolic (congestive) heart failure: Secondary | ICD-10-CM | POA: Diagnosis not present

## 2020-09-04 DIAGNOSIS — E860 Dehydration: Secondary | ICD-10-CM | POA: Diagnosis not present

## 2020-09-08 DIAGNOSIS — E559 Vitamin D deficiency, unspecified: Secondary | ICD-10-CM | POA: Diagnosis not present

## 2020-09-08 DIAGNOSIS — E860 Dehydration: Secondary | ICD-10-CM | POA: Diagnosis not present

## 2020-09-08 DIAGNOSIS — E0829 Diabetes mellitus due to underlying condition with other diabetic kidney complication: Secondary | ICD-10-CM | POA: Diagnosis not present

## 2020-09-08 DIAGNOSIS — I5020 Unspecified systolic (congestive) heart failure: Secondary | ICD-10-CM | POA: Diagnosis not present

## 2020-09-09 DIAGNOSIS — I5020 Unspecified systolic (congestive) heart failure: Secondary | ICD-10-CM | POA: Diagnosis not present

## 2020-09-09 DIAGNOSIS — I83014 Varicose veins of right lower extremity with ulcer of heel and midfoot: Secondary | ICD-10-CM | POA: Diagnosis not present

## 2020-09-09 DIAGNOSIS — Z79899 Other long term (current) drug therapy: Secondary | ICD-10-CM | POA: Diagnosis not present

## 2020-09-09 DIAGNOSIS — E119 Type 2 diabetes mellitus without complications: Secondary | ICD-10-CM | POA: Diagnosis not present

## 2020-09-09 DIAGNOSIS — N19 Unspecified kidney failure: Secondary | ICD-10-CM | POA: Diagnosis not present

## 2020-09-09 DIAGNOSIS — H353134 Nonexudative age-related macular degeneration, bilateral, advanced atrophic with subfoveal involvement: Secondary | ICD-10-CM | POA: Diagnosis not present

## 2020-09-09 DIAGNOSIS — L89622 Pressure ulcer of left heel, stage 2: Secondary | ICD-10-CM | POA: Diagnosis not present

## 2020-09-09 DIAGNOSIS — Z961 Presence of intraocular lens: Secondary | ICD-10-CM | POA: Diagnosis not present

## 2020-09-09 DIAGNOSIS — L988 Other specified disorders of the skin and subcutaneous tissue: Secondary | ICD-10-CM | POA: Diagnosis not present

## 2020-09-09 DIAGNOSIS — F0390 Unspecified dementia without behavioral disturbance: Secondary | ICD-10-CM | POA: Diagnosis not present

## 2020-09-11 DIAGNOSIS — E860 Dehydration: Secondary | ICD-10-CM | POA: Diagnosis not present

## 2020-09-11 DIAGNOSIS — E0829 Diabetes mellitus due to underlying condition with other diabetic kidney complication: Secondary | ICD-10-CM | POA: Diagnosis not present

## 2020-09-11 DIAGNOSIS — E559 Vitamin D deficiency, unspecified: Secondary | ICD-10-CM | POA: Diagnosis not present

## 2020-09-11 DIAGNOSIS — I5020 Unspecified systolic (congestive) heart failure: Secondary | ICD-10-CM | POA: Diagnosis not present

## 2020-09-13 DIAGNOSIS — M5431 Sciatica, right side: Secondary | ICD-10-CM | POA: Diagnosis not present

## 2020-09-13 DIAGNOSIS — M62838 Other muscle spasm: Secondary | ICD-10-CM | POA: Diagnosis not present

## 2020-09-13 DIAGNOSIS — R7989 Other specified abnormal findings of blood chemistry: Secondary | ICD-10-CM | POA: Diagnosis not present

## 2020-09-16 DIAGNOSIS — I5030 Unspecified diastolic (congestive) heart failure: Secondary | ICD-10-CM | POA: Diagnosis not present

## 2020-09-16 DIAGNOSIS — F0390 Unspecified dementia without behavioral disturbance: Secondary | ICD-10-CM | POA: Diagnosis not present

## 2020-09-16 DIAGNOSIS — R293 Abnormal posture: Secondary | ICD-10-CM | POA: Diagnosis not present

## 2020-09-16 DIAGNOSIS — L89622 Pressure ulcer of left heel, stage 2: Secondary | ICD-10-CM | POA: Diagnosis not present

## 2020-09-16 DIAGNOSIS — L988 Other specified disorders of the skin and subcutaneous tissue: Secondary | ICD-10-CM | POA: Diagnosis not present

## 2020-09-16 DIAGNOSIS — I10 Essential (primary) hypertension: Secondary | ICD-10-CM | POA: Diagnosis not present

## 2020-09-16 DIAGNOSIS — R279 Unspecified lack of coordination: Secondary | ICD-10-CM | POA: Diagnosis not present

## 2020-09-16 DIAGNOSIS — I83014 Varicose veins of right lower extremity with ulcer of heel and midfoot: Secondary | ICD-10-CM | POA: Diagnosis not present

## 2020-09-16 DIAGNOSIS — M6281 Muscle weakness (generalized): Secondary | ICD-10-CM | POA: Diagnosis not present

## 2020-09-16 DIAGNOSIS — R2681 Unsteadiness on feet: Secondary | ICD-10-CM | POA: Diagnosis not present

## 2020-09-16 IMAGING — CT CT T SPINE W/O CM
3 of 4 series · 9 of 33 positions shown, 11 images · non-contrast
Comparison: None.

CLINICAL DATA: Fall, back injury, back pain

EXAM:
CT THORACIC SPINE WITHOUT CONTRAST
TECHNIQUE: Multidetector CT images of the thoracic were obtained using the
standard protocol without intravenous contrast.

[Series 4: t spine st · axial · 0.44mm/px · z∈[-425,-425]mm · 1 of 183 slices shown, 2 images]
[im 92/183  soft-tissue]
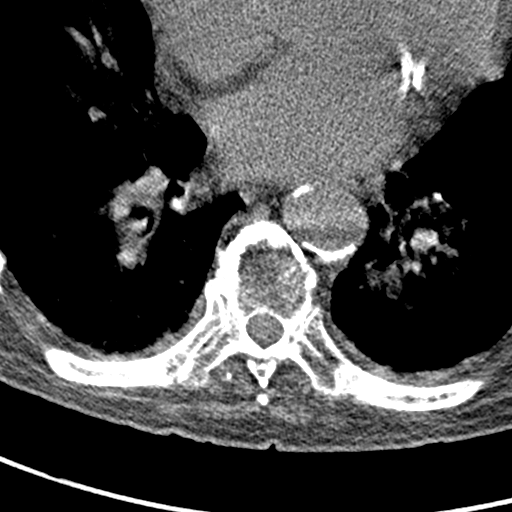
[im 92/183  bone]
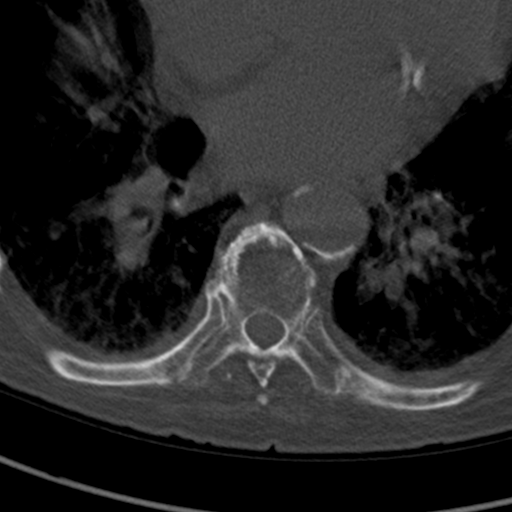

[Series 8: coronal bone · coronal · 0.33mm/px · 3 of 106 slices shown]
[im 22/106  bone]
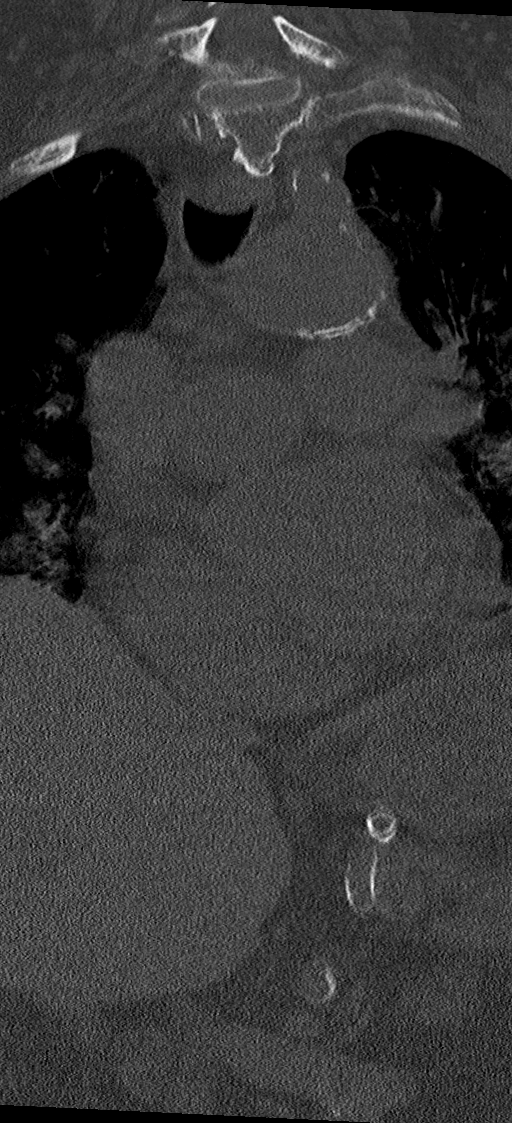
[im 43/106  bone]
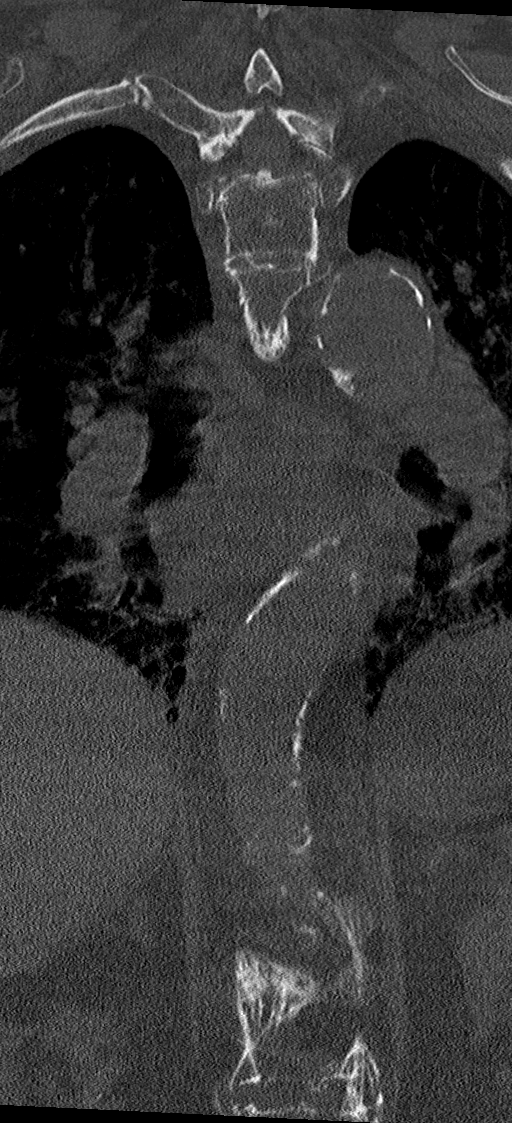
[im 64/106  bone]
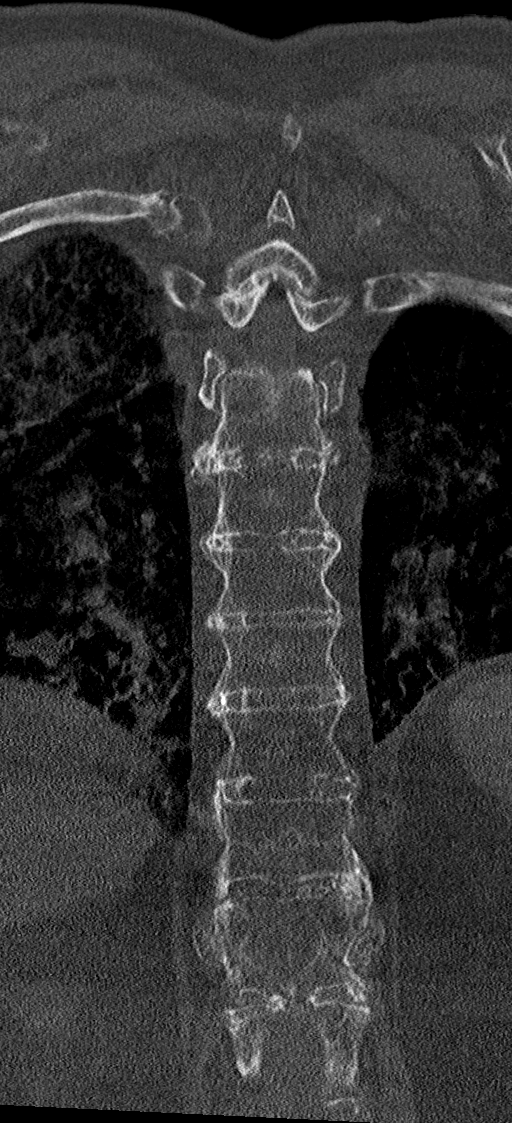

[Series 9: sagittal bone · sagittal · 0.41mm/px · 5 of 86 slices shown, 6 images]
[im 29/86  bone]
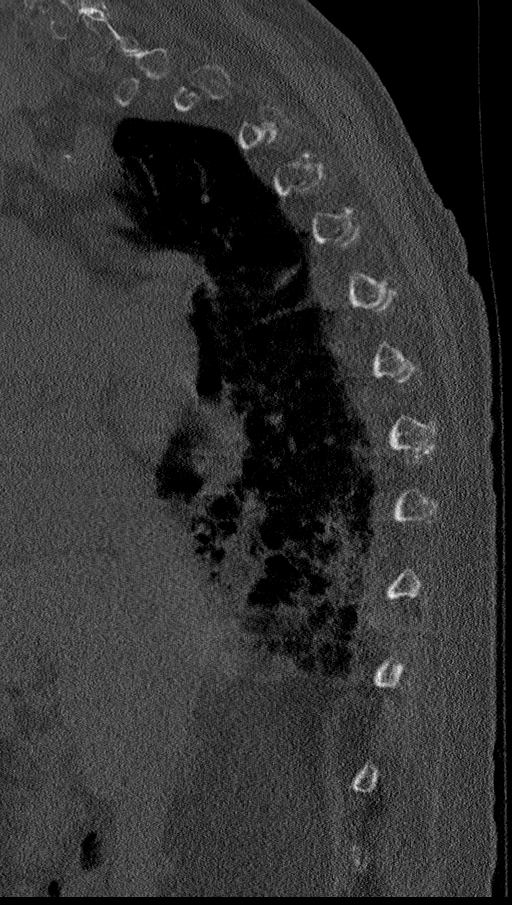
[im 36/86  bone]
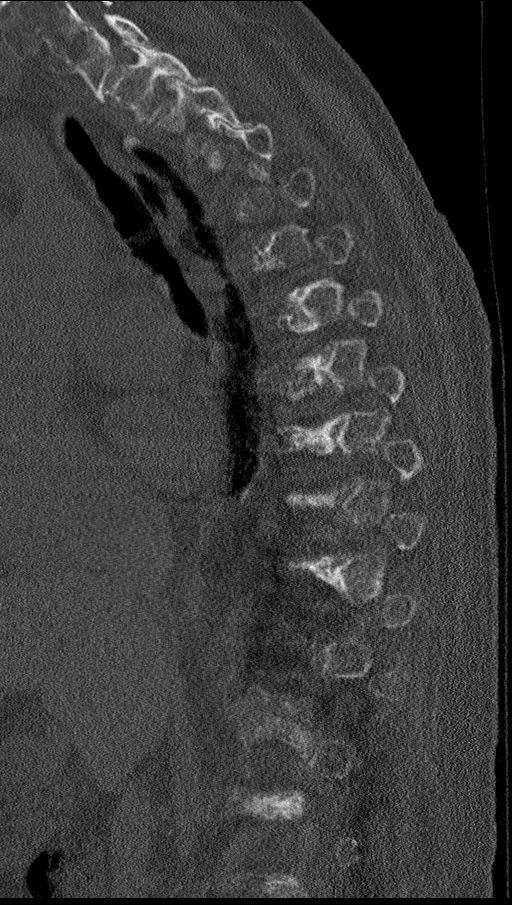
[im 43/86  soft-tissue]
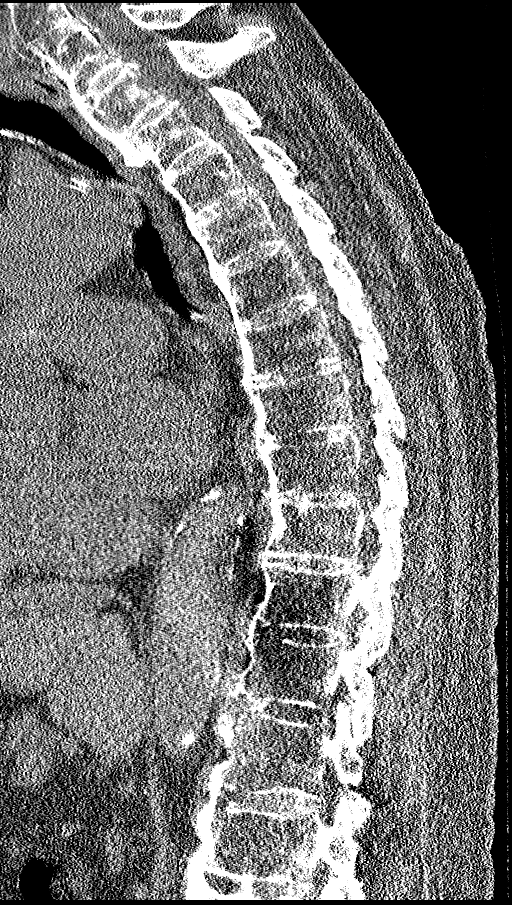
[im 43/86  bone]
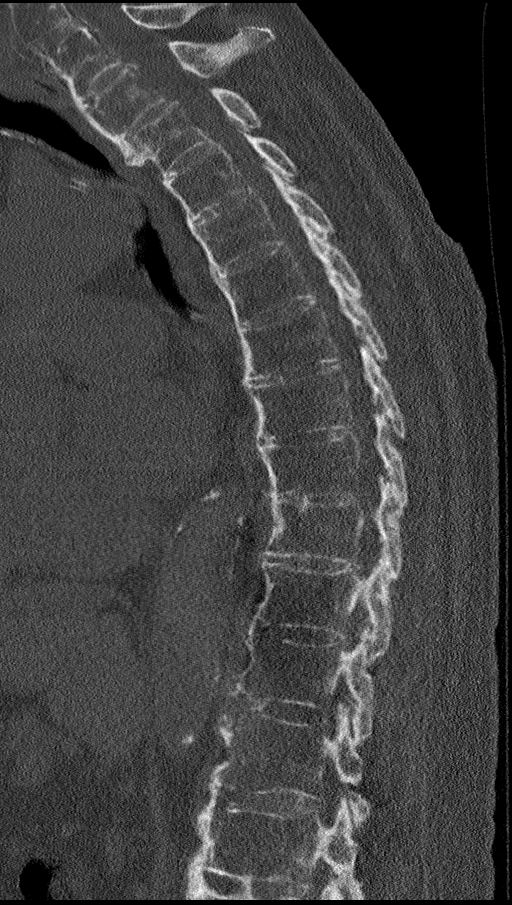
[im 50/86  bone]
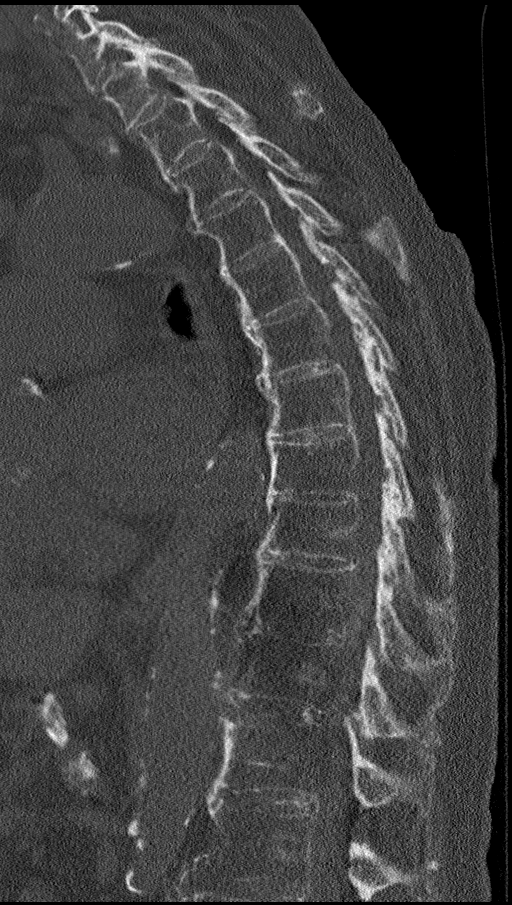
[im 57/86  bone]
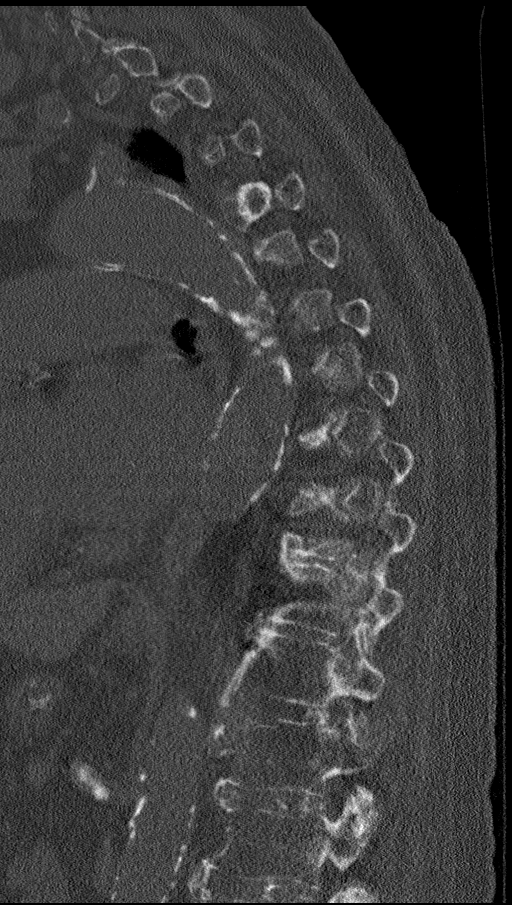

[9 of 33 positions shown; findings below may reference images not displayed]

FINDINGS: Alignment: Normal thoracic kyphosis.  No listhesis.

Vertebrae: The osseous structures are diffusely osteopenic. There is
no acute fracture of the thoracic spine. Vertebral body height has
been preserved. No lytic or blastic bone lesions.

Paraspinal and other soft tissues: The paraspinal soft tissues are
unremarkable. Reticular infiltrate within the dependent visualized
lower lobes likely represents atelectasis. The central pulmonary
arteries are enlarged in keeping with changes of pulmonary arterial
hypertension. Extensive coronary artery calcification is seen within
the left main coronary artery. Cholelithiasis noted. 15 mm fusiform
rim calcified aneurysm of the celiac axis is identified.

Disc levels: There is intervertebral disc space narrowing and disc
calcification throughout the thoracic spine in keeping with diffuse
degenerative disc disease. There is ossification of the anterior
longitudinal ligament, likely degenerative in nature. The spinal
canal is widely patent on sagittal imaging. Review of the axial
images demonstrates multiple level degenerative change with
resultant severe right neural foraminal narrowing at T3-4 secondary
to facet arthrosis. Remaining neural foramina are widely patent.
Posterior disc osteophyte complex in combination with mild facet
arthrosis results in mild central canal stenosis with mild
flattening of the thecal sac at T9-10. Posterior disc herniation
results in moderate central canal stenosis and bilateral moderate to
severe neural foraminal narrowing at T10-11. Posterior disc
herniation in combination with facet arthrosis results in moderate
central canal stenosis and moderate bilateral neural foraminal
narrowing at T11-12.
IMPRESSION: No acute fracture or traumatic listhesis of the thoracic spine. Note
that imaging is limited by severe osteopenia. Multilevel central
canal stenosis at T9-T12 secondary to degenerative change in
posterior disc herniations. This would be better assessed with MRI
examination if indicated.

## 2020-10-12 DEATH — deceased
